# Patient Record
Sex: Female | Born: 2000 | Race: Black or African American | Hispanic: No | Marital: Single | Smoking: Never smoker
Health system: Southern US, Community
[De-identification: ages and names within clinical notes are randomized; demographics above are authoritative.]

---
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
## 2019-12-05 ENCOUNTER — Emergency Department
Admission: EM | Admit: 2019-12-05 | Discharge: 2019-12-05 | Disposition: A | Discharge status: Home / Self Care | Payer: 59 | Attending: Emergency Medicine | Admitting: Emergency Medicine

## 2019-12-05 ENCOUNTER — Other Ambulatory Visit: Payer: Self-pay

## 2019-12-05 DIAGNOSIS — F331 Major depressive disorder, recurrent, moderate: Secondary | ICD-10-CM

## 2019-12-05 DIAGNOSIS — F1290 Cannabis use, unspecified, uncomplicated: Secondary | ICD-10-CM

## 2019-12-05 DIAGNOSIS — S61213A Laceration without foreign body of left middle finger without damage to nail, initial encounter: Secondary | ICD-10-CM | POA: Diagnosis not present

## 2019-12-05 DIAGNOSIS — F1210 Cannabis abuse, uncomplicated: Secondary | ICD-10-CM | POA: Diagnosis not present

## 2019-12-05 DIAGNOSIS — Y9389 Activity, other specified: Secondary | ICD-10-CM | POA: Insufficient documentation

## 2019-12-05 DIAGNOSIS — R4689 Other symptoms and signs involving appearance and behavior: Secondary | ICD-10-CM | POA: Diagnosis not present

## 2019-12-05 DIAGNOSIS — Z23 Encounter for immunization: Secondary | ICD-10-CM | POA: Insufficient documentation

## 2019-12-05 DIAGNOSIS — E860 Dehydration: Secondary | ICD-10-CM | POA: Insufficient documentation

## 2019-12-05 DIAGNOSIS — Y998 Other external cause status: Secondary | ICD-10-CM | POA: Diagnosis not present

## 2019-12-05 DIAGNOSIS — Y92018 Other place in single-family (private) house as the place of occurrence of the external cause: Secondary | ICD-10-CM | POA: Insufficient documentation

## 2019-12-05 DIAGNOSIS — Z046 Encounter for general psychiatric examination, requested by authority: Secondary | ICD-10-CM | POA: Insufficient documentation

## 2019-12-05 DIAGNOSIS — R45851 Suicidal ideations: Secondary | ICD-10-CM

## 2019-12-05 DIAGNOSIS — S6992XA Unspecified injury of left wrist, hand and finger(s), initial encounter: Secondary | ICD-10-CM | POA: Diagnosis present

## 2019-12-05 LAB — CBC
HCT: 35.1 % — ABNORMAL LOW (ref 36.0–46.0)
Hemoglobin: 11.4 g/dL — ABNORMAL LOW (ref 12.0–15.0)
MCH: 27.7 pg (ref 26.0–34.0)
MCHC: 32.5 g/dL (ref 30.0–36.0)
MCV: 85.2 fL (ref 80.0–100.0)
Platelets: 250 10*3/uL (ref 150–400)
RBC: 4.12 MIL/uL (ref 3.87–5.11)
RDW: 14 % (ref 11.5–15.5)
WBC: 5.9 10*3/uL (ref 4.0–10.5)
nRBC: 0 % (ref 0.0–0.2)

## 2019-12-05 LAB — URINALYSIS, COMPLETE (UACMP) WITH MICROSCOPIC
Bacteria, UA: NONE SEEN
Glucose, UA: NEGATIVE mg/dL
Ketones, ur: 80 mg/dL — AB
Leukocytes,Ua: NEGATIVE
Nitrite: NEGATIVE
Protein, ur: 100 mg/dL — AB
RBC / HPF: NONE SEEN RBC/hpf (ref 0–5)
Specific Gravity, Urine: >1.03 — ABNORMAL HIGH (ref 1.005–1.030)
pH: 6 (ref 5.0–8.0)

## 2019-12-05 LAB — COMPREHENSIVE METABOLIC PANEL
ALT: 14 U/L (ref 0–44)
AST: 23 U/L (ref 15–41)
Albumin: 4.7 g/dL (ref 3.5–5.0)
Alkaline Phosphatase: 41 U/L (ref 38–126)
Anion gap: 13 (ref 5–15)
BUN: 19 mg/dL (ref 6–20)
CO2: 22 mmol/L (ref 22–32)
Calcium: 9.3 mg/dL (ref 8.9–10.3)
Chloride: 105 mmol/L (ref 98–111)
Creatinine, Ser: 0.85 mg/dL (ref 0.44–1.00)
GFR calc Af Amer: >60 mL/min (ref >60)
GFR calc non Af Amer: >60 mL/min (ref >60)
Glucose, Bld: 84 mg/dL (ref 70–99)
Potassium: 3.4 mmol/L — ABNORMAL LOW (ref 3.5–5.1)
Sodium: 140 mmol/L (ref 135–145)
Total Bilirubin: 1.5 mg/dL — ABNORMAL HIGH (ref 0.3–1.2)
Total Protein: 7.7 g/dL (ref 6.5–8.1)

## 2019-12-05 LAB — URINE DRUG SCREEN, QUALITATIVE (ARMC ONLY)
Amphetamines, Ur Screen: NOT DETECTED
Barbiturates, Ur Screen: NOT DETECTED
Benzodiazepine, Ur Scrn: NOT DETECTED
Cannabinoid 50 Ng, Ur ~~LOC~~: POSITIVE — AB
Cocaine Metabolite,Ur ~~LOC~~: NOT DETECTED
MDMA (Ecstasy)Ur Screen: NOT DETECTED
Methadone Scn, Ur: NOT DETECTED
Opiate, Ur Screen: NOT DETECTED
Phencyclidine (PCP) Ur S: NOT DETECTED
Tricyclic, Ur Screen: NOT DETECTED

## 2019-12-05 LAB — SALICYLATE LEVEL: Salicylate Lvl: <7 mg/dL — ABNORMAL LOW (ref 7.0–30.0)

## 2019-12-05 LAB — ETHANOL: Alcohol, Ethyl (B): <10 mg/dL (ref <10)

## 2019-12-05 LAB — ACETAMINOPHEN LEVEL: Acetaminophen (Tylenol), Serum: <10 ug/mL — ABNORMAL LOW (ref 10–30)

## 2019-12-05 LAB — POCT PREGNANCY, URINE: Preg Test, Ur: NEGATIVE

## 2019-12-05 MED ORDER — TETANUS-DIPHTH-ACELL PERTUSSIS 5-2.5-18.5 LF-MCG/0.5 IM SUSP
0.5000 mL | Freq: Once | INTRAMUSCULAR | Status: AC
  Administered 2019-12-05: 0.5 mL via INTRAMUSCULAR
  Filled 2019-12-05: qty 0.5

## 2019-12-05 NOTE — ED Notes (Signed)
Patient denies HI/SI. Patient AO X 3. Patient calm and cooperative. Patient given water per request.

## 2019-12-05 NOTE — Discharge Instructions (Signed)
Follow-up with your regular doctor and/or psychiatrist.  Continue taking your medications as prescribed.  Return to the ER for new or recurrent thoughts of suicide, any other thoughts of wanting to hurt yourself or anyone else, or any other new or worsening symptoms that concern you.
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
## 2019-12-05 NOTE — ED Notes (Signed)
Pt discharged home with [employer] (for a ride).   VS stable. Pt denies SI/HI.  All belongings returned to patient. Discharge instructions and housing resources given to patient. Pt signed for discharge.
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
## 2019-12-05 NOTE — ED Notes (Signed)
Patient given water per request.
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
## 2019-12-05 NOTE — ED Provider Notes (Signed)
11:54 AM Patient has been seen and evaluated by psychiatry team and deemed appropriate for discharge. Patient determined not to be a threat to themselves or others. IVC rescinded by psychiatry team. Patient is otherwise medically clear and stable for discharge.       Miguel Aschoff., MD 12/05/19 1154

## 2019-12-05 NOTE — ED Notes (Signed)
Psychiatrist at bedside

## 2019-12-05 NOTE — ED Notes (Signed)
Meal tray provided.

## 2021-01-14 ENCOUNTER — Emergency Department (HOSPITAL_COMMUNITY): Payer: 59

## 2021-01-14 ENCOUNTER — Emergency Department (HOSPITAL_COMMUNITY)
Admission: EM | Admit: 2021-01-14 | Discharge: 2021-01-15 | Disposition: A | Discharge status: Home / Self Care | Payer: 59 | Attending: Emergency Medicine | Admitting: Emergency Medicine

## 2021-01-14 ENCOUNTER — Other Ambulatory Visit: Payer: Self-pay

## 2021-01-14 ENCOUNTER — Encounter (HOSPITAL_COMMUNITY): Payer: Self-pay | Admitting: Emergency Medicine

## 2021-01-14 DIAGNOSIS — W458XXA Other foreign body or object entering through skin, initial encounter: Secondary | ICD-10-CM | POA: Diagnosis not present

## 2021-01-14 DIAGNOSIS — S91031A Puncture wound without foreign body, right ankle, initial encounter: Secondary | ICD-10-CM | POA: Diagnosis not present

## 2021-01-14 DIAGNOSIS — T148XXA Other injury of unspecified body region, initial encounter: Secondary | ICD-10-CM

## 2021-01-14 DIAGNOSIS — W268XXA Contact with other sharp object(s), not elsewhere classified, initial encounter: Secondary | ICD-10-CM | POA: Diagnosis not present

## 2021-01-14 DIAGNOSIS — S99911A Unspecified injury of right ankle, initial encounter: Secondary | ICD-10-CM | POA: Diagnosis present

## 2021-01-14 MED ORDER — IBUPROFEN 400 MG PO TABS
600.0000 mg | ORAL_TABLET | Freq: Once | ORAL | Status: AC
  Administered 2021-01-14: 600 mg via ORAL
  Filled 2021-01-14: qty 1

## 2021-01-14 NOTE — ED Triage Notes (Signed)
Patient stated a small piece of wire punctured her right lateral ankle this evening , no bleeding noted , patient reports mild swelling with pain .

## 2021-01-14 NOTE — ED Provider Notes (Signed)
Emergency Medicine Provider Triage Evaluation Note  Monica Richardson , a 20 y.o. female  was evaluated in triage.  Pt complains of right ankle pain.  Patient reports earlier this evening she was trying to carry a suitcase and there was a piece of wire sticking out from this that went into the side of her right ankle.  She reports she was able to pull out the wire, is unable to explain how deep this went into her ankle.  She reports at first she felt okay but then tried to walk on it and had increasing pain.  Review of Systems  Positive: Ankle pain Negative: Fever, numbness, weakness  Physical Exam  BP 117/68 (BP Location: Right Arm)   Pulse 64   Temp 97.9 F (36.6 C) (Oral)   Resp 17   SpO2 100%  Gen:   Awake, no distress  Resp:  Normal effort  MSK:   Moves extremities without difficulty, tenderness and slight swelling noted over the right ankle, primarily over the lateral malleolus, there is a small pinpoint scab over the malleolus, no bleeding, no obvious bony deformity  Medical Decision Making  Medically screening exam initiated at 9:00 PM.  Appropriate orders placed.  Enzley Kitchens was informed that the remainder of the evaluation will be completed by another provider, this initial triage assessment does not replace that evaluation, and the importance of remaining in the ED until their evaluation is complete.   Legrand Rams 01/14/21 2106    Gerhard Munch, MD 01/14/21 2153

## 2021-01-15 MED ORDER — CIPROFLOXACIN HCL 500 MG PO TABS: 500.0000 mg | ORAL_TABLET | Freq: Two times a day (BID) | ORAL | 0 refills | Status: AC

## 2021-01-15 MED ORDER — CIPROFLOXACIN HCL 500 MG PO TABS
500.0000 mg | ORAL_TABLET | Freq: Once | ORAL | Status: AC
  Administered 2021-01-15: 500 mg via ORAL
  Filled 2021-01-15: qty 1

## 2021-01-15 NOTE — ED Notes (Signed)
The pt walked out without the sl limp

## 2021-01-15 NOTE — ED Provider Notes (Signed)
[facility] EMERGENCY DEPARTMENT Provider Note   CSN: [id number] Arrival date & time: 01/14/21  2017     History Chief Complaint  Patient presents with  . Ankle Pain     Monica Richardson is a 20 y.o. female.  20 yo F here after puncturing her right lateral ankle with a piece of wire. Had swelling. Had pain. Got some crutches whch helped. No meds PTA. No other injuries.         History reviewed. No pertinent past medical history.  Patient Active Problem List   Diagnosis Date Noted  . Aggressive behavior     History reviewed. No pertinent surgical history.   OB History   No obstetric history on file.     No family history on file.  Social History   Tobacco Use  . Smoking status: Never Smoker  . Smokeless tobacco: Never Used  Substance Use Topics  . Alcohol use: No  . Drug use: Yes    Types: Marijuana    Home Medications Prior to Admission medications   Medication Sig Start Date End Date Taking? Authorizing Provider  ciprofloxacin (CIPRO) 500 MG tablet Take 1 tablet (500 mg total) by mouth 2 (two) times daily for 7 days. 01/15/21 01/22/21 Yes Dellene Mcgroarty, Barbara Cower, MD    Allergies    Patient has no known allergies.  Review of Systems   Review of Systems  All other systems reviewed and are negative.   Physical Exam Updated Vital Signs BP 125/78   Pulse 61   Temp 98.1 F (36.7 C) (Oral)   Resp 17   Ht 5\' 8"  (1.727 m)   Wt 56.7 kg   LMP 01/14/2021   SpO2 98%   BMI 19.01 kg/m   Physical Exam Vitals and nursing note reviewed.  Constitutional:      Appearance: She is well-developed.  HENT:     Head: Normocephalic and atraumatic.     Mouth/Throat:     Mouth: Mucous membranes are moist.     Pharynx: Oropharynx is clear.  Eyes:     Pupils: Pupils are equal, round, and reactive to light.  Cardiovascular:     Rate and Rhythm: Normal rate and regular rhythm.  Pulmonary:     Effort: No respiratory distress.     Breath sounds: No stridor.   Abdominal:     General: There is no distension.  Musculoskeletal:     Cervical back: Normal range of motion.  Skin:    General: Skin is warm.     Comments: Small hemostatic wound to dorsal right lateral foot with surrounding edema. No erythema or warmth.   Neurological:     General: No focal deficit present.     Mental Status: She is alert.     ED Results / Procedures / Treatments   Labs (all labs ordered are listed, but only abnormal results are displayed) Labs Reviewed - No data to display  EKG None  Radiology DG Ankle Complete Right  Result Date: 01/14/2021 CLINICAL DATA:  Status post trauma. EXAM: RIGHT ANKLE - COMPLETE 3+ VIEW COMPARISON:  None. FINDINGS: There is no evidence of fracture, dislocation, or joint effusion. There is no evidence of arthropathy or other focal bone abnormality. Very mild anterior and lateral soft tissue swelling is seen. IMPRESSION: Very mild soft tissue swelling without an acute osseous abnormality. Electronically Signed   By: 03/16/2021 M.D.   On: 01/14/2021 21:51    Procedures Procedures   Medications Ordered in ED  Medications  ciprofloxacin (CIPRO) tablet 500 mg (has no administration in time range)  ibuprofen (ADVIL) tablet 600 mg (600 mg Oral Given 01/14/21 2118)    ED Course  I have reviewed the triage vital signs and the nursing notes.  Pertinent labs & imaging results that were available during my care of the patient were reviewed by me and considered in my medical decision making (see chart for details).    MDM Rules/Calculators/A&P                          ppx treatment for pseudomonas with cipro since wire went through shoe into foot. everyting else ok. Stable for discharge.   Final Clinical Impression(s) / ED Diagnoses Final diagnoses:  Puncture wound    Rx / DC Orders ED Discharge Orders         Ordered    ciprofloxacin (CIPRO) 500 MG tablet  2 times daily        01/15/21 0237           Beverlyann Broxterman, Barbara Cower,  MD 01/15/21 [phone]

## 2021-01-15 NOTE — ED Notes (Signed)
The pt c/o a wire from a suitcase went into her rt ankle small puncture wound. No active bleeding

## 2021-04-11 ENCOUNTER — Other Ambulatory Visit: Payer: Self-pay

## 2021-04-11 ENCOUNTER — Ambulatory Visit (HOSPITAL_COMMUNITY)
Admission: EM | Admit: 2021-04-11 | Discharge: 2021-04-12 | Disposition: A | Discharge status: Other Acute Inpatient Hospital | Payer: 59 | Attending: Urology | Admitting: Urology

## 2021-04-11 DIAGNOSIS — Z20822 Contact with and (suspected) exposure to covid-19: Secondary | ICD-10-CM | POA: Insufficient documentation

## 2021-04-11 DIAGNOSIS — F23 Brief psychotic disorder: Secondary | ICD-10-CM | POA: Insufficient documentation

## 2021-04-11 DIAGNOSIS — T7651XA Adult forced sexual exploitation, suspected, initial encounter: Secondary | ICD-10-CM | POA: Insufficient documentation
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
## 2021-04-12 ENCOUNTER — Emergency Department (HOSPITAL_COMMUNITY)
Admission: EM | Admit: 2021-04-12 | Discharge: 2021-04-12 | Disposition: A | Discharge status: Other Acute Inpatient Hospital | Payer: 59 | Attending: Student | Admitting: Student

## 2021-04-12 ENCOUNTER — Encounter (HOSPITAL_COMMUNITY): Payer: Self-pay | Admitting: Urology

## 2021-04-12 ENCOUNTER — Inpatient Hospital Stay (HOSPITAL_COMMUNITY)
Admission: AD | Admit: 2021-04-12 | Discharge: 2021-04-24 | DRG: 885 | Disposition: A | Discharge status: Home / Self Care | Payer: 59 | Source: Ambulatory Visit | Attending: Emergency Medicine | Admitting: Emergency Medicine

## 2021-04-12 ENCOUNTER — Encounter (HOSPITAL_COMMUNITY): Payer: Self-pay | Admitting: Emergency Medicine

## 2021-04-12 DIAGNOSIS — R001 Bradycardia, unspecified: Secondary | ICD-10-CM | POA: Insufficient documentation

## 2021-04-12 DIAGNOSIS — F23 Brief psychotic disorder: Secondary | ICD-10-CM | POA: Diagnosis present

## 2021-04-12 DIAGNOSIS — Z20822 Contact with and (suspected) exposure to covid-19: Secondary | ICD-10-CM | POA: Diagnosis present

## 2021-04-12 DIAGNOSIS — F29 Unspecified psychosis not due to a substance or known physiological condition: Secondary | ICD-10-CM | POA: Diagnosis present

## 2021-04-12 DIAGNOSIS — R259 Unspecified abnormal involuntary movements: Secondary | ICD-10-CM | POA: Insufficient documentation

## 2021-04-12 DIAGNOSIS — R4689 Other symptoms and signs involving appearance and behavior: Secondary | ICD-10-CM | POA: Diagnosis not present

## 2021-04-12 DIAGNOSIS — Z79899 Other long term (current) drug therapy: Secondary | ICD-10-CM | POA: Diagnosis not present

## 2021-04-12 DIAGNOSIS — T7651XA Adult forced sexual exploitation, suspected, initial encounter: Secondary | ICD-10-CM | POA: Diagnosis not present

## 2021-04-12 DIAGNOSIS — F918 Other conduct disorders: Secondary | ICD-10-CM | POA: Diagnosis present

## 2021-04-12 DIAGNOSIS — F1210 Cannabis abuse, uncomplicated: Secondary | ICD-10-CM | POA: Diagnosis present

## 2021-04-12 DIAGNOSIS — F22 Delusional disorders: Secondary | ICD-10-CM | POA: Insufficient documentation

## 2021-04-12 DIAGNOSIS — I440 Atrioventricular block, first degree: Secondary | ICD-10-CM | POA: Diagnosis present

## 2021-04-12 DIAGNOSIS — Z046 Encounter for general psychiatric examination, requested by authority: Secondary | ICD-10-CM | POA: Diagnosis not present

## 2021-04-12 LAB — COMPREHENSIVE METABOLIC PANEL
ALT: 15 U/L (ref 0–44)
AST: 26 U/L (ref 15–41)
Albumin: 4.6 g/dL (ref 3.5–5.0)
Alkaline Phosphatase: 48 U/L (ref 38–126)
Anion gap: 9 (ref 5–15)
BUN: 10 mg/dL (ref 6–20)
CO2: 22 mmol/L (ref 22–32)
Calcium: 9.6 mg/dL (ref 8.9–10.3)
Chloride: 105 mmol/L (ref 98–111)
Creatinine, Ser: 0.87 mg/dL (ref 0.44–1.00)
GFR, Estimated: >60 mL/min (ref >60)
Glucose, Bld: 83 mg/dL (ref 70–99)
Potassium: 3 mmol/L — ABNORMAL LOW (ref 3.5–5.1)
Sodium: 136 mmol/L (ref 135–145)
Total Bilirubin: 0.8 mg/dL (ref 0.3–1.2)
Total Protein: 7.7 g/dL (ref 6.5–8.1)

## 2021-04-12 LAB — CBC
HCT: 36.4 % (ref 36.0–46.0)
Hemoglobin: 11.6 g/dL — ABNORMAL LOW (ref 12.0–15.0)
MCH: 27.4 pg (ref 26.0–34.0)
MCHC: 31.9 g/dL (ref 30.0–36.0)
MCV: 85.8 fL (ref 80.0–100.0)
Platelets: 193 10*3/uL (ref 150–400)
RBC: 4.24 MIL/uL (ref 3.87–5.11)
RDW: 16.3 % — ABNORMAL HIGH (ref 11.5–15.5)
WBC: 6.2 10*3/uL (ref 4.0–10.5)
nRBC: 0 % (ref 0.0–0.2)

## 2021-04-12 LAB — RAPID URINE DRUG SCREEN, HOSP PERFORMED
Amphetamines: NOT DETECTED
Barbiturates: NOT DETECTED
Benzodiazepines: NOT DETECTED
Cocaine: NOT DETECTED
Opiates: NOT DETECTED
Tetrahydrocannabinol: POSITIVE — AB

## 2021-04-12 LAB — HEMOGLOBIN A1C
Hgb A1c MFr Bld: 5.4 % (ref 4.8–5.6)
Mean Plasma Glucose: 108.28 mg/dL

## 2021-04-12 LAB — POC SARS CORONAVIRUS 2 AG -  ED: SARS Coronavirus 2 Ag: NEGATIVE

## 2021-04-12 LAB — PREGNANCY, URINE: Preg Test, Ur: NEGATIVE

## 2021-04-12 LAB — TSH: TSH: 1.988 u[IU]/mL (ref 0.350–4.500)

## 2021-04-12 LAB — LIPID PANEL
Cholesterol: 201 mg/dL — ABNORMAL HIGH (ref 0–200)
HDL: 51 mg/dL (ref >40)
LDL Cholesterol: 138 mg/dL — ABNORMAL HIGH (ref 0–99)
Total CHOL/HDL Ratio: 3.9 RATIO
Triglycerides: 58 mg/dL (ref <150)
VLDL: 12 mg/dL (ref 0–40)

## 2021-04-12 LAB — RESP PANEL BY RT-PCR (FLU A&B, COVID) ARPGX2
Influenza A by PCR: NEGATIVE
Influenza B by PCR: NEGATIVE
SARS Coronavirus 2 by RT PCR: NEGATIVE

## 2021-04-12 MED ORDER — ALUM & MAG HYDROXIDE-SIMETH 200-200-20 MG/5ML PO SUSP
30.0000 mL | ORAL | Status: DC | PRN
Start: 2021-04-12 — End: 2021-04-24

## 2021-04-12 MED ORDER — ACETAMINOPHEN 325 MG PO TABS: 650.0000 mg | ORAL_TABLET | ORAL | Status: DC | PRN

## 2021-04-12 MED ORDER — LORAZEPAM 1 MG PO TABS
1.0000 mg | ORAL_TABLET | ORAL | Status: DC | PRN
Start: 2021-04-12 — End: 2021-04-13

## 2021-04-12 MED ORDER — ONDANSETRON HCL 4 MG PO TABS: 4.0000 mg | ORAL_TABLET | Freq: Three times a day (TID) | ORAL | Status: DC | PRN

## 2021-04-12 MED ORDER — ALUM & MAG HYDROXIDE-SIMETH 200-200-20 MG/5ML PO SUSP: 30.0000 mL | Freq: Four times a day (QID) | ORAL | Status: DC | PRN

## 2021-04-12 MED ORDER — ZIPRASIDONE MESYLATE 20 MG IM SOLR: 20.0000 mg | INTRAMUSCULAR | Status: DC | PRN

## 2021-04-12 MED ORDER — OLANZAPINE 10 MG PO TBDP
10.0000 mg | ORAL_TABLET | Freq: Every day | ORAL | Status: DC
Start: 2021-04-12 — End: 2021-04-13
  Filled 2021-04-12 (×2): qty 1

## 2021-04-12 MED ORDER — POTASSIUM CHLORIDE CRYS ER 20 MEQ PO TBCR
20.0000 meq | EXTENDED_RELEASE_TABLET | Freq: Two times a day (BID) | ORAL | Status: DC
Start: 2021-04-12 — End: 2021-04-14
  Filled 2021-04-12 (×8): qty 1

## 2021-04-12 MED ORDER — TRAZODONE HCL 50 MG PO TABS: 50.0000 mg | ORAL_TABLET | Freq: Every evening | ORAL | Status: DC | PRN

## 2021-04-12 MED ORDER — ZIPRASIDONE MESYLATE 20 MG IM SOLR
20.0000 mg | INTRAMUSCULAR | Status: AC | PRN
  Administered 2021-04-12: 20 mg via INTRAMUSCULAR
  Filled 2021-04-12: qty 20

## 2021-04-12 MED ORDER — POTASSIUM CHLORIDE CRYS ER 20 MEQ PO TBCR
40.0000 meq | EXTENDED_RELEASE_TABLET | ORAL | Status: DC
  Administered 2021-04-12: 40 meq via ORAL
  Filled 2021-04-12: qty 2

## 2021-04-12 MED ORDER — HYDROXYZINE HCL 25 MG PO TABS: 25.0000 mg | ORAL_TABLET | Freq: Once | ORAL | Status: DC

## 2021-04-12 MED ORDER — OLANZAPINE 5 MG PO TBDP: 5.0000 mg | ORAL_TABLET | Freq: Three times a day (TID) | ORAL | Status: DC | PRN

## 2021-04-12 MED ORDER — MAGNESIUM HYDROXIDE 400 MG/5ML PO SUSP
30.0000 mL | Freq: Every day | ORAL | Status: DC | PRN
Start: 2021-04-12 — End: 2021-04-24

## 2021-04-12 MED ORDER — LORAZEPAM 1 MG PO TABS: 1.0000 mg | ORAL_TABLET | ORAL | Status: DC | PRN

## 2021-04-12 MED ORDER — ZOLPIDEM TARTRATE 5 MG PO TABS: 5.0000 mg | ORAL_TABLET | Freq: Every evening | ORAL | Status: DC | PRN

## 2021-04-12 MED ORDER — HYDROXYZINE HCL 25 MG PO TABS
25.0000 mg | ORAL_TABLET | Freq: Three times a day (TID) | ORAL | Status: DC | PRN
  Administered 2021-04-16: 25 mg via ORAL
  Filled 2021-04-12: qty 1

## 2021-04-12 MED ORDER — ACETAMINOPHEN 325 MG PO TABS
650.0000 mg | ORAL_TABLET | Freq: Four times a day (QID) | ORAL | Status: DC | PRN
Start: 2021-04-12 — End: 2021-04-24

## 2021-04-12 MED ORDER — OLANZAPINE 5 MG PO TBDP
5.0000 mg | ORAL_TABLET | Freq: Three times a day (TID) | ORAL | Status: DC | PRN
Start: 2021-04-12 — End: 2021-04-13

## 2021-04-12 NOTE — ED Provider Notes (Addendum)
[facility]-EMERGENCY DEPT Provider Note   CSN: [id number] Arrival date & time: 04/12/21  0448     History Chief Complaint  Patient presents with   IVC    Monica Richardson is a 20 y.o. female without significant past medical history who presents to the emergency department via police under IVC from behavioral health urgent care.  Patient states she is not entirely sure why she is here, she states that she went to the magistrates office due to safety concerns, she has no complaints at this time.  No alleviating or aggravating factors.  She denies fever, chest pain, cough, dyspnea, abdominal pain, vomiting, or diarrhea.  Per chart review behavioral health team discussed with patient's cousin who filled out IVC paperwork and is her roommate who relayed that she has had aggressive behavior and at times seems delusional.   HPI     History reviewed. No pertinent past medical history.  Patient Active Problem List   Diagnosis Date Noted   Aggressive behavior     History reviewed. No pertinent surgical history.   OB History   No obstetric history on file.     History reviewed. No pertinent family history.  Social History   Tobacco Use   Smoking status: Never   Smokeless tobacco: Never  Substance Use Topics   Alcohol use: No   Drug use: Yes    Types: Marijuana    Home Medications Prior to Admission medications   Not on File    Allergies    Patient has no known allergies.  Review of Systems   Review of Systems  Constitutional:  Negative for chills and fever.  Respiratory:  Negative for cough and shortness of breath.   Cardiovascular:  Negative for chest pain.  Gastrointestinal:  Negative for abdominal pain, diarrhea and vomiting.  Neurological:  Negative for syncope.  All other systems reviewed and are negative.  Physical Exam Updated Vital Signs BP 124/83 (BP Location: Left Arm)   Pulse (!) 52   Temp 97.9 F (36.6 C) (Oral)   Resp 16   Ht  5\' 7"  (1.702 m)   Wt 54.4 kg   SpO2 100%   BMI 18.79 kg/m   Physical Exam Vitals and nursing note reviewed.  Constitutional:      General: She is not in acute distress.    Appearance: She is well-developed. She is not toxic-appearing.  HENT:     Head: Normocephalic and atraumatic.  Eyes:     General:        Right eye: No discharge.        Left eye: No discharge.     Conjunctiva/sclera: Conjunctivae normal.  Cardiovascular:     Rate and Rhythm: Regular rhythm. Bradycardia present.  Pulmonary:     Effort: Pulmonary effort is normal. No respiratory distress.     Breath sounds: Normal breath sounds. No wheezing, rhonchi or rales.  Abdominal:     General: There is no distension.     Palpations: Abdomen is soft.     Tenderness: There is no abdominal tenderness.  Musculoskeletal:     Cervical back: Neck supple.  Skin:    General: Skin is warm and dry.     Findings: No rash.  Neurological:     Mental Status: She is alert.     Comments: Clear speech.   Psychiatric:        Behavior: Behavior is withdrawn.     Comments: Inattentive.     ED Results /  Procedures / Treatments   Labs (all labs ordered are listed, but only abnormal results are displayed) Labs Reviewed  PREGNANCY, URINE  RAPID URINE DRUG SCREEN, HOSP PERFORMED    EKG None  Radiology No results found.  Procedures Procedures   Medications Ordered in ED Medications - No data to display  ED Course  I have reviewed the triage vital signs and the nursing notes.  Pertinent labs & imaging results that were available during my care of the patient were reviewed by me and considered in my medical decision making (see chart for details).    MDM Rules/Calculators/A&P                           Patient presents to the emergency department from behavioral health urgent care under IVC, current recommendation is for inpatient psychiatric treatment, pending placement.  She is nontoxic, vitals are without significant  abnormality.  She has no current complaints.  She does seem somewhat inattentive and withdrawn.  COVID test was obtained at behavioral urgent care and was negative, basic labs were also obtained there as well.  UDS and urine pregnancy test ordered in the emergency department as she was not able to provide a sample at [facility].   I discussed patient care with Cecilio Asper NP with [facility]- plan for inpatient psychiatric treatment, likely will be placed at [facility] pending discharges this AM. Cannot hold at [facility] due to IVC therefore being held in the ED. Confirms only need preg test & UDS in the ED which have been ordered. Patient medically cleared. Consult placed to TTS to keep patient on [facility] team list.   The patient has been placed in psychiatric observation due to the need to provide a safe environment for the patient while obtaining psychiatric consultation and evaluation, as well as ongoing medical and medication management to treat the patient's condition.  The patient has been placed under full IVC at this time.  Final Clinical Impression(s) / ED Diagnoses Final diagnoses:  Involuntary commitment    Rx / DC Orders ED Discharge Orders     None        Cherly Anderson, PA-C 04/12/21 0548  CBC w/ mild anemia.  CMP w/ mild hypokalemia- oral potassium ordered.     Cherly Anderson, PA-C 04/12/21 4503    Geoffery Lyons, MD 04/13/21 [phone]

## 2021-04-12 NOTE — ED Notes (Signed)
Pt could not urinate at this time. uds cup was given to pt when she has to go to restroom

## 2021-04-12 NOTE — ED Provider Notes (Signed)
Patient accepted behavioral health hospital.  Vital signs stable.  Stable for transfer   Linwood Dibbles, MD 04/12/21 [phone]

## 2021-04-12 NOTE — ED Notes (Signed)
Report called to [facility] RN charge nurse @[facility] 

## 2021-04-12 NOTE — ED Notes (Signed)
[employer] CALLED FOR TRNASPORT TO [facility] ED
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
## 2021-04-12 NOTE — ED Triage Notes (Signed)
Pt sent from [facility]. They have worked her up and determined that she meets inpatient, but say that they cant hold IVCs. They sent her here to hold.

## 2021-04-12 NOTE — Progress Notes (Signed)
Psychoeducational Group Note  Date:  04/12/2021 Time:  2126  Group Topic/Focus:  Wrap-Up Group:   The focus of this group is to help patients review their daily goal of treatment and discuss progress on daily workbooks.  Participation Level: Did Not Attend  Participation Quality:  Not Applicable  Affect:  Not Applicable  Cognitive:  Not Applicable  Insight:  Not Applicable  Engagement in Group: Not Applicable  Additional Comments:  The patient did not attend group this evening.   Hazle Coca S 04/12/2021, 9:26 PM

## 2021-04-12 NOTE — Progress Notes (Signed)
Admission Note:   20 Year old presents IVC was accepted for the treatment for SI thoughts and Depression. Pt appears flat, guarded and depressed. Pt was calm and cooperative with admission process. Poor eye contact. Pt denies SI/AVH/HI and pain on admission. Pt perception of hospitalization is " I went to the magistrate and then they just sent me here".   Skin was assessed and found to be clear of any abnormal marks apart from a larger healed scar on the right thigh area which pt refused to discuss. PT searched and no contraband found, POC and unit policies explained and understanding verbalized. Consents obtained. Food and fluids offered and both accepted. Pt had no additional questions or concerns. Will continue to monitor and assess. Safety maintained.

## 2021-04-12 NOTE — BH Assessment (Addendum)
BHH Assessment Progress Note   Per Cecilio Asper, NP, this pt requires psychiatric hospitalization.  Danika, RN, [facility] has assigned pt to [facility] Rm 402-1 to the service of Dr Mason Jim.  [facility] will be ready to receive pt at 10:30.  Pt presents under IVC initiated by pt's cousin which EDP Linwood Dibbles, MD agrees to [facility].  IVC documents will be faxed to [facility] once First Examination is completed.  Dr Lynelle Doctor and pt's nurse, Waynetta Sandy, have been notified, and Waynetta Sandy agrees to call report to [phone].  Pt is to be transported via Patent examiner.   Doylene Canning, [place] Behavioral Health Coordinator [phone]   Addendum:  First Examination has been completed, and IVC documents have been faxed to [facility].  Pt is ready to be transferred.  Dr Lynelle Doctor and Waynetta Sandy have been notified.  Doylene Canning, [place] Behavioral Health Coordinator [phone]

## 2021-04-12 NOTE — ED Notes (Signed)
Pt resting@this time. Pt calm and cooperative. No c/o of pain or distress. Will continue to monitor for safety  

## 2021-04-12 NOTE — Discharge Instructions (Signed)
Transferred to [facility]

## 2021-04-12 NOTE — ED Triage Notes (Signed)
Pt went to [facility] because IVC papers were taken out.  [place] called police back because they can only keep IVC patients for 4 hours.  Pt under IVC because pt has lost contact with reality and thinks people are out to get her and is a danger to herself because she states she needs to take herself out.  IVC papers state pt is hearing voices and having visual hallucinations as well.

## 2021-04-12 NOTE — Progress Notes (Signed)
Patient presents with  labile and blunted affect. Patient refused her hs Zyprexa stated she does not needs medication it was just a miss understanding.   Patient awake at 0146 asking why she is shaking and why they gave her two big pills of potasium at the other hospital because she does not take medications. Patient asking why she is here stating she is only here because of the miss understanding with the Magistery. Patient is calm refused any medications at present. Patient denies SI/HI/A/VH and verbally contracted for safety.   Support and encouragement provided as needed.

## 2021-04-12 NOTE — Progress Notes (Signed)
Did not attend group 

## 2021-04-12 NOTE — ED Notes (Signed)
Pt was dressed out into to purple scrubs. Pt's belongings of pants, t shirt, bra, and bracelet was placed into 1-4 cabinet.

## 2021-04-12 NOTE — ED Notes (Signed)
Pt demanding to get her out of here

## 2021-04-12 NOTE — ED Notes (Signed)
Pt DC d off unit to [facility] per provider. Pt alert, cooperative, no s/s of distress. DC information and belongings given to [employer]. Pt ambulatory off unit, escorted and transported by [employer].

## 2021-04-13 DIAGNOSIS — F1210 Cannabis abuse, uncomplicated: Secondary | ICD-10-CM | POA: Diagnosis present

## 2021-04-13 DIAGNOSIS — R4689 Other symptoms and signs involving appearance and behavior: Secondary | ICD-10-CM | POA: Diagnosis not present

## 2021-04-13 DIAGNOSIS — F29 Unspecified psychosis not due to a substance or known physiological condition: Secondary | ICD-10-CM | POA: Diagnosis not present

## 2021-04-13 LAB — COMPREHENSIVE METABOLIC PANEL
ALT: 13 U/L (ref 0–44)
AST: 23 U/L (ref 15–41)
Albumin: 5 g/dL (ref 3.5–5.0)
Alkaline Phosphatase: 55 U/L (ref 38–126)
Anion gap: 10 (ref 5–15)
BUN: 16 mg/dL (ref 6–20)
CO2: 24 mmol/L (ref 22–32)
Calcium: 10.2 mg/dL (ref 8.9–10.3)
Chloride: 105 mmol/L (ref 98–111)
Creatinine, Ser: 0.87 mg/dL (ref 0.44–1.00)
GFR, Estimated: >60 mL/min (ref >60)
Glucose, Bld: 130 mg/dL — ABNORMAL HIGH (ref 70–99)
Potassium: 4.6 mmol/L (ref 3.5–5.1)
Sodium: 139 mmol/L (ref 135–145)
Total Bilirubin: 0.6 mg/dL (ref 0.3–1.2)
Total Protein: 8.1 g/dL (ref 6.5–8.1)

## 2021-04-13 LAB — SEDIMENTATION RATE: Sed Rate: 6 mm/hr (ref 0–22)

## 2021-04-13 LAB — VITAMIN B12: Vitamin B-12: 329 pg/mL (ref 180–914)

## 2021-04-13 MED ORDER — ENSURE ENLIVE PO LIQD
237.0000 mL | Freq: Two times a day (BID) | ORAL | Status: DC
  Administered 2021-04-13 – 2021-04-24 (×4): 237 mL via ORAL
  Filled 2021-04-13 (×26): qty 237

## 2021-04-13 MED ORDER — ZIPRASIDONE MESYLATE 20 MG IM SOLR: 20.0000 mg | INTRAMUSCULAR | Status: DC | PRN

## 2021-04-13 MED ORDER — OLANZAPINE 10 MG PO TBDP
10.0000 mg | ORAL_TABLET | Freq: Three times a day (TID) | ORAL | Status: DC | PRN
  Administered 2021-04-16: 10 mg via ORAL
  Filled 2021-04-13: qty 1

## 2021-04-13 MED ORDER — ZIPRASIDONE MESYLATE 20 MG IM SOLR
20.0000 mg | INTRAMUSCULAR | Status: AC | PRN
  Administered 2021-04-13: 20 mg via INTRAMUSCULAR
  Filled 2021-04-13: qty 20

## 2021-04-13 MED ORDER — LORAZEPAM 2 MG/ML IJ SOLN
1.0000 mg | Freq: Once | INTRAMUSCULAR | Status: AC
  Administered 2021-04-13: 1 mg via INTRAMUSCULAR

## 2021-04-13 MED ORDER — LORAZEPAM 1 MG PO TABS
2.0000 mg | ORAL_TABLET | ORAL | Status: DC | PRN
Start: 2021-04-13 — End: 2021-04-13

## 2021-04-13 MED ORDER — ZIPRASIDONE MESYLATE 20 MG IM SOLR: 20.0000 mg | Freq: Two times a day (BID) | INTRAMUSCULAR | Status: DC | PRN

## 2021-04-13 MED ORDER — OLANZAPINE 5 MG PO TBDP
5.0000 mg | ORAL_TABLET | Freq: Every day | ORAL | Status: DC
  Filled 2021-04-13 (×2): qty 1

## 2021-04-13 MED ORDER — LORAZEPAM 1 MG PO TABS: 2.0000 mg | ORAL_TABLET | Freq: Four times a day (QID) | ORAL | Status: DC | PRN

## 2021-04-13 MED ORDER — LORAZEPAM 2 MG/ML IJ SOLN
INTRAMUSCULAR | Status: AC
  Filled 2021-04-13: qty 1

## 2021-04-13 MED ORDER — OLANZAPINE 10 MG PO TBDP
10.0000 mg | ORAL_TABLET | Freq: Every day | ORAL | Status: DC
  Filled 2021-04-13 (×3): qty 1

## 2021-04-13 MED ORDER — LORAZEPAM 2 MG/ML IJ SOLN: 2.0000 mg | Freq: Four times a day (QID) | INTRAMUSCULAR | Status: DC | PRN

## 2021-04-13 MED ORDER — LORAZEPAM 1 MG PO TABS
1.0000 mg | ORAL_TABLET | ORAL | Status: DC | PRN
Start: 2021-04-13 — End: 2021-04-16
  Filled 2021-04-13: qty 1

## 2021-04-13 NOTE — Progress Notes (Addendum)
Pt agitated and cursing in the hallway. Pt slamming her hands on the nurses station and yelling at staff. Says she doesn't need to be here. "It's a misunderstanding. I'm just here to pass the time that's all. I'm not here to take any fucking medicine! What y'all want from me?" Pt on the phone yelling at no one because phones were turned off. Pt now at end of hall talking to someone who is not there.  Pt walking up and down the hall agitated. Walked into another pt's room and had to be redirected to leave.  Provider notified. Pt has agitation protocol. Has already refused PO meds. Provider gave order for Ativan 1 mg IM. Pt will be given Geodon 20 mg IM along with Ativan 1 mg IM for severe agitation.Will continue to monitor.

## 2021-04-13 NOTE — Progress Notes (Addendum)
Pt given agitation medications using manual hold for 15 seconds to administer injections. Pt continues to stand outside of room, yelling at everyone. Pt redirected back to her room. Pt still cursing. MHT continues to try and redirect patient. MHT sitting outside pt room until medication that was given begins to take effect. Pt refused vital sign assessment. Will continue to monitor.

## 2021-04-13 NOTE — BHH Group Notes (Signed)
Type of Therapy and Topic:  Group Therapy:  Strengths Exploration   Participation Level: Did not attend   Description of Group: This group allows individuals to explore their strengths, learn to use strengths in new ways to improve well-being. Strengths-based interventions involve identifying strengths, understanding how they are used, and learning new ways to apply them. Individuals will identify their strengths, and then explore their roles in different areas of life (relationships, professional life, and personal fulfillment). Individuals will think about ways in which they currently use their strengths, along with new ways they could begin using them.    Therapeutic Goals Patient will verbalize two of their strengths Patient will identify how their strengths are currently used Patient will identify two new ways to apply their strengths  Patients will create a plan to apply their strengths in their daily lives     Summary of Patient Progress:  Did not attend  
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
## 2021-04-13 NOTE — Progress Notes (Addendum)
Patient became very upset this morning talking on phone to her family.  Stated someone had lied to her, etc.  Patient was sitting in hallway of 400 hall, yelling, fidegity.  Patient did go to her room to rest.  Patient was moved to 500 hall.  Respirations even and unlabored at this time.  No signs/symptoms of pain/distress on patient's face/body movements Safety maintained with 15 minute checks. Marland Kitchen

## 2021-04-13 NOTE — Progress Notes (Signed)
Pt listed Monica Richardson [phone]) her cousin as her contact if ever placed in restraints. Person listed was contacted to inform him of manual hold for medication administration. Person verbalized understanding.
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
## 2021-04-13 NOTE — Progress Notes (Signed)
Recreation Therapy Notes  Date: 8.3.22 Time: 0930 Location: 300 Hall Dayroom  Group Topic: Stress Management   Goal Area(s) Addresses:  Patient will actively participate in stress management techniques presented during session.  Patient will successfully identify benefit of practicing stress management post d/c.   Intervention: Guided exercise with ambient sound and script  Activity :Guided Imagery  LRT read a script that focused on finding peace and relaxation while envisioning being on the beach at sunrise. Patients were given suggestions of ways to access scripts post d/c and encouraged to explore Youtube and other apps available on smartphones, tablets, and computers.   Education:  Stress Management, Discharge Planning.   Education Outcome: Acknowledges education  Clinical Observations/Feedback: Patient did not attend group session.     Caroll Rancher, LRT/CTRS         Caroll Rancher A 04/13/2021 11:51 AM

## 2021-04-13 NOTE — Tx Team (Signed)
Interdisciplinary Treatment and Diagnostic Plan Update  04/13/2021 Time of Session: 9:30am  Monica Richardson MRN: [id number]  Principal Diagnosis: <principal problem not specified>  Secondary Diagnoses: Active Problems:   Acute psychosis ([place])   Current Medications:  Current Facility-Administered Medications  Medication Dose Route Frequency Provider Last Rate Last Admin   acetaminophen (TYLENOL) tablet 650 mg  650 mg Oral Q6H PRN Prescilla Sours, PA-C       alum & mag hydroxide-simeth (MAALOX/MYLANTA) 200-200-20 MG/5ML suspension 30 mL  30 mL Oral Q4H PRN Margorie John W, PA-C       hydrOXYzine (ATARAX/VISTARIL) tablet 25 mg  25 mg Oral TID PRN Margorie John W, PA-C       OLANZapine zydis (ZYPREXA) disintegrating tablet 5 mg  5 mg Oral Q8H PRN Ethelene Hal, NP       And   LORazepam (ATIVAN) tablet 1 mg  1 mg Oral PRN Ethelene Hal, NP       And   ziprasidone (GEODON) injection 20 mg  20 mg Intramuscular PRN Ethelene Hal, NP       magnesium hydroxide (MILK OF MAGNESIA) suspension 30 mL  30 mL Oral Daily PRN Margorie John W, PA-C       OLANZapine zydis (ZYPREXA) disintegrating tablet 10 mg  10 mg Oral QHS Ethelene Hal, NP       potassium chloride SA (KLOR-CON) CR tablet 20 mEq  20 mEq Oral BID Ethelene Hal, NP       traZODone (DESYREL) tablet 50 mg  50 mg Oral QHS PRN Prescilla Sours, PA-C       PTA Medications: No medications prior to admission.    Patient Stressors:    Patient Strengths:    Treatment Modalities: Medication Management, Group therapy, Case management,  1 to 1 session with clinician, Psychoeducation, Recreational therapy.   Physician Treatment Plan for Primary Diagnosis: <principal problem not specified> Long Term Goal(s):     Short Term Goals:    Medication Management: Evaluate patient's response, side effects, and tolerance of medication regimen.  Therapeutic Interventions: 1 to 1 sessions, Unit Group sessions and  Medication administration.  Evaluation of Outcomes: Not Met  Physician Treatment Plan for Secondary Diagnosis: Active Problems:   Acute psychosis ([place])  Long Term Goal(s):     Short Term Goals:       Medication Management: Evaluate patient's response, side effects, and tolerance of medication regimen.  Therapeutic Interventions: 1 to 1 sessions, Unit Group sessions and Medication administration.  Evaluation of Outcomes: Not Met   RN Treatment Plan for Primary Diagnosis: <principal problem not specified> Long Term Goal(s): Knowledge of disease and therapeutic regimen to maintain health will improve  Short Term Goals: Ability to remain free from injury will improve, Ability to participate in decision making will improve, Ability to verbalize feelings will improve, Ability to disclose and discuss suicidal ideas, and Ability to identify and develop effective coping behaviors will improve  Medication Management: RN will administer medications as ordered by provider, will assess and evaluate patient's response and provide education to patient for prescribed medication. RN will report any adverse and/or side effects to prescribing provider.  Therapeutic Interventions: 1 on 1 counseling sessions, Psychoeducation, Medication administration, Evaluate responses to treatment, Monitor vital signs and CBGs as ordered, Perform/monitor CIWA, COWS, AIMS and Fall Risk screenings as ordered, Perform wound care treatments as ordered.  Evaluation of Outcomes: Not Met   LCSW Treatment Plan for Primary Diagnosis: <principal problem not specified>  Long Term Goal(s): Safe transition to appropriate next level of care at discharge, Engage patient in therapeutic group addressing interpersonal concerns.  Short Term Goals: Engage patient in aftercare planning with referrals and resources, Increase social support, Increase emotional regulation, Facilitate acceptance of mental health diagnosis and concerns,  Identify triggers associated with mental health/substance abuse issues, and Increase skills for wellness and recovery  Therapeutic Interventions: Assess for all discharge needs, 1 to 1 time with Social worker, Explore available resources and support systems, Assess for adequacy in community support network, Educate family and significant other(s) on suicide prevention, Complete Psychosocial Assessment, Interpersonal group therapy.  Evaluation of Outcomes: Not Met   Progress in Treatment: Attending groups: No. Participating in groups: No. Taking medication as prescribed: Yes. Toleration medication: Yes. Family/Significant other contact made: Yes, individual(s) contacted:  If consents are provided Patient understands diagnosis: No. Discussing patient identified problems/goals with staff: Yes. Medical problems stabilized or resolved: Yes. Denies suicidal/homicidal ideation: Yes. Issues/concerns per patient self-inventory: No.   New problem(s) identified: No, Describe:  None   New Short Term/Long Term Goal(s): medication stabilization, elimination of SI thoughts, development of comprehensive mental wellness plan.   Patient Goals: "To go home"   Discharge Plan or Barriers: Patient recently admitted. CSW will continue to follow and assess for appropriate referrals and possible discharge planning.   Reason for Continuation of Hospitalization: Anxiety Hallucinations Medication stabilization  Estimated Length of Stay: 3 to 5 days   Attendees: Patient: Monica Richardson 04/13/2021   Physician: Lestine Mount, DO 04/13/2021   Nursing:  04/13/2021   RN Care Manager: 04/13/2021   Social Worker: Verdis Frederickson, LCSWA 04/13/2021   Recreational Therapist:  04/13/2021   Other:  04/13/2021   Other:  04/13/2021   Other: 04/13/2021     Scribe for Treatment Team: Darleen Crocker, [place] 04/13/2021 9:54 AM

## 2021-04-13 NOTE — Plan of Care (Signed)
Nurse discussed anxiety, depression and coping skills with patient.  

## 2021-04-14 DIAGNOSIS — F29 Unspecified psychosis not due to a substance or known physiological condition: Secondary | ICD-10-CM | POA: Diagnosis not present

## 2021-04-14 LAB — CERULOPLASMIN: Ceruloplasmin: 19.9 mg/dL (ref 19.0–39.0)

## 2021-04-14 LAB — RPR: RPR Ser Ql: NONREACTIVE

## 2021-04-14 LAB — ANA: Anti Nuclear Antibody (ANA): NEGATIVE

## 2021-04-14 MED ORDER — OLANZAPINE 10 MG PO TBDP
10.0000 mg | ORAL_TABLET | Freq: Every day | ORAL | Status: DC
  Administered 2021-04-14: 10 mg via ORAL
  Filled 2021-04-14 (×2): qty 1

## 2021-04-14 MED ORDER — OLANZAPINE 10 MG IM SOLR
5.0000 mg | Freq: Every day | INTRAMUSCULAR | Status: DC
  Filled 2021-04-14 (×2): qty 10

## 2021-04-14 NOTE — Progress Notes (Signed)
Psychoeducational Group Note  Date:  04/14/2021 Time:  2038  Group Topic/Focus:  Wrap-Up Group:   The focus of this group is to help patients review their daily goal of treatment and discuss progress on daily workbooks.  Participation Level: Did Not Attend  Participation Quality:  Not Applicable  Affect:  Not Applicable  Cognitive:  Not Applicable  Insight:  Not Applicable  Engagement in Group: Not Applicable  Additional Comments:  The patient did not attend group this evening.   Hazle Coca S 04/14/2021, 8:39 PM

## 2021-04-14 NOTE — Progress Notes (Addendum)
   04/13/21 1950  Psych Admission Type (Psych Patients Only)  Admission Status Involuntary  Psychosocial Assessment  Patient Complaints Anger;Irritability  Eye Contact Brief;Avoids  Facial Expression Anxious;Angry  Affect Labile  Speech Loud;Aggressive  Interaction Minimal;No initiation  Motor Activity Pacing  Appearance/Hygiene In scrubs  Behavior Characteristics Unwilling to participate;Agressive verbally;Agitated;Irritable;Pacing  Mood Labile;Suspicious;Angry  Aggressive Behavior  Targets Other (Comment) (staff)  Type of Behavior Verbal;Unprovoked  Effect No apparent injury  Thought Process  Coherency Circumstantial  Content Blaming others  Delusions None reported or observed  Perception Hallucinations  Hallucination Auditory;Visual (pt denies AVH but seen talking to someone not there at the end of the hallway)  Judgment Poor  Confusion None  Danger to Self  Current suicidal ideation? Denies  Danger to Others  Danger to Others Reported or observed  Danger to Others Abnormal  Harmful Behavior to others Threats of violence towards other people observed or expressed   Destructive Behavior No threats or harm toward property  Description of Harmful Behavior verbally aggressive and threatening towards staff   Pt seen in her room. Pt avertive and giving short quick answers. Pt denied SI, HI, AVH and pain. Pt irritable and angry about medications.

## 2021-04-14 NOTE — Progress Notes (Signed)
Pt did not attend orientation/goals group. 

## 2021-04-14 NOTE — Progress Notes (Signed)
Recreation Therapy Notes  INPATIENT RECREATION THERAPY ASSESSMENT  Patient Details Name: Clarissa Laird MRN: [id number] DOB: [birth year] Today's Date: 04/14/2021       Information Obtained From: Patient  Able to Participate in Assessment/Interview: Yes  Patient Presentation: Alert  Reason for Admission (Per Patient): Other (Comments) (Pt stated she went to the magistrate's office about a misunderstanding, went home and was arrested and brought here.)  Patient Stressors: Other (Comment) (Needs own apartment, working on going back in school)  Coping Skills:   Sports, Exercise, Music, Talk, Art, Prayer, Avoidance, Read, Dance, Hot Bath/Shower  Leisure Interests (2+):  Exercise - Running, Exercise - Lifting Weights, Social - Friends, Individual - Other (Comment), Community - Other (Comment) (Cooking; Church)  Frequency of Recreation/Participation: Other (Comment) (Cook/Friends/Exercise- Daily; Church- Sometimes)  Awareness of Community Resources:  Yes  Community Resources:  Research scientist (physical sciences), [place], Tree surgeon, Public affairs consultant  Current Use: Yes  If no, Barriers?:    Expressed Interest in [employer] Information: No  [employer] of Residence:  [place]  Patient Main Form of Transportation: Set designer  Patient Strengths:  Keeping people relaxed; Helpful; Keeping up with the world  Patient Identified Areas of Improvement:  None  Patient Goal for Hospitalization:  "get through it"  Current SI (including self-harm):  No  Current HI:  No  Current AVH: No  Staff Intervention Plan: Group Attendance, Collaborate with Interdisciplinary Treatment Team  Consent to Intern Participation: N/A   Caroll Rancher, LRT/CTRS  Caroll Rancher A 04/14/2021, 2:38 PM

## 2021-04-14 NOTE — Progress Notes (Signed)
[facility] Second Physician Opinion Progress Note for Medication Administration to Non-consenting Patients (For Involuntarily Committed Patients)  Patient: Monica Richardson Date of Birth: 08/10/2001 MRN: [id number]  I was asked to see the patient by her attending psychiatrist Dr. Amy Singleton for second opinion regarding medication administration to a nonconsenting patient.  Medical record reviewed.  Patient's case discussed in detail with treatment team and nursing staff.  Monica Richardson is a 20-year-old female admitted under IVC petition due to worsening psychotic symptoms, paranoia, erratic behavior and aggression.  Since admission the patient has continued to present as guarded, paranoid, delusional, pressured, intermittently agitated, yelling, irritable, labile, disorganized and religiously preoccupied.  Staff have observed patient responding to internal stimuli.  Patient has refused scheduled medications since admission including scheduled PO olanzapine and PO potassium supplement.  Yesterday evening patient became agitated, pacing the hallways, entered another patient's room, and was yelling and swearing at staff.  She was unable to be verbally redirected, refused p.o. medications and received IM lorazepam and IM Geodon.  I met with and evaluated the patient on the unit today with nursing staff present.  On my interview with patient this morning, she appears tired from IM medications she received last night.  Patient presents as guarded, irritable, paranoid and delusional.  Patient maintains that she is in the hospital because there was a misunderstanding by the magistrate after she went to the police department to inquire about an app that could be used to watch a person on their phone.  Patient states that the behaviors prior to admission reported by family did not occur.  She denies all psychiatric symptoms stating that she is emotional only because she is a [place].  Patient denies that she has a psychiatric  condition and states her belief she does not need medications.  Reason for the Medication: The patient, without the benefit of the specific treatment measure, is incapable of participating in any available treatment plan that will give the patient a realistic opportunity of improving the patient's condition. There is, without the benefit of the specific treatment measure, a significant possibility that the patient will harm self or others before improvement of the patient's condition is realized.  Consideration of Side Effects: Consideration of the side effects related to the medication plan has been given.  Rationale for Medication Administration: Patient has no insight into her psychiatric condition, the reasons for hospitalization or the need for treatment.  As a result of her psychiatric symptoms, patient is unable to meaningfully participate in decisions regarding her treatment, including decisions about medication.  Without treatment, patient's symptoms are likely to continue and to result in increased disability to patient and future incidents of aggression towards others.  Based on my overall assessment, believe the potential benefits to patient of administering medications over objection outweigh the potential risks.     L , MD 04/14/21  8:28 AM   This documentation is good for (7) seven days from the date of the MD signature. New documentation must be completed every seven (7) days with detailed justification in the medical record if the patient requires continued non-emergent administration of psychotropic medications. 

## 2021-04-14 NOTE — [facility] Group Notes (Signed)
Occupational Therapy Group Note Date: 04/14/2021 Group Topic/Focus: Coping Skills  Group Description: Group Description: Group encouraged increased engagement and participation through discussion focused on coping through music. Group members were encouraged to create a "coping skills playlist" that consisted of 20 songs with different cited categories/topics including "a song that reminds you of a good memory" and "a song that makes you want to dance", etc. Discussion followed with patients sharing their playlists and choosing one for the group to listen and respond too.   Therapeutic Goal(s): Identify positive songs to improve coping through music Identify and share benefit of music as a coping strategy  Participation Level: Patient did not attend OT group session despite personal invitation from [facility].   Plan: Continue to engage patient in OT groups 2 - 3x/week.  04/14/2021  Donne Hazel, MOT, OTR/L
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
## 2021-04-14 NOTE — Progress Notes (Signed)
Recreation Therapy Notes  Date: 8.4.22 Time: 1000 Location: 500 Hall Dayroom   Group Topic: Coping Skills   Goal Area(s) Addresses: Patient will define what a coping skill is. Patient will create a list of healthy coping skills beginning with each letter of the alphabet. Patient will successfully identify positive coping skills they can use post d/c. Patient will acknowledge benefit(s) of using learned coping skills post d/c.   Intervention: Blank Coping Skills A to Z sheet, Pencils   Activity: Coping A to Z. Patient asked to identify what a coping skill is and when they use them. Patients with Clinical research associate discussed healthy versus unhealthy coping skills. Next patients were given a blank worksheet titled "Coping Skills A-Z".  Partners were instructed to come up with at least one positive coping skill per letter of the alphabet, addressing a specific challenge (ex: stress, anger, anxiety, depression, grief, doubt, isolation, self-harm/suicidal thoughts, substance use). Patients were given 15 minutes to brainstorm before ideas were presented to the large group. Patients and LRT debriefed on the importance of coping skill selection based on situation and back-up plans when a skill tried is not effective. At the end of group, patients were given an handout of alphabetized strategies to keep for future reference.   Education: Pharmacologist, Scientist, physiological, Discharge Planning.   Education Outcome: Acknowledges education/Verbalizes understanding/In group clarification offered/Additional education needed   Clinical Observations/Feedback: Pt did not attend group session.      Caroll Rancher, LRT/CTRS         Caroll Rancher A 04/14/2021 12:18 PM

## 2021-04-14 NOTE — Plan of Care (Signed)
  Problem: Education: Goal: Knowledge of [facility] Education information/materials will improve Outcome: Progressing Goal: Emotional status will improve Outcome: Progressing Goal: Mental status will improve Outcome: Progressing Goal: Verbalization of understanding the information provided will improve Outcome: Progressing   
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
## 2021-04-15 DIAGNOSIS — F29 Unspecified psychosis not due to a substance or known physiological condition: Secondary | ICD-10-CM | POA: Diagnosis not present

## 2021-04-15 MED ORDER — OLANZAPINE 15 MG PO TBDP
15.0000 mg | ORAL_TABLET | Freq: Every day | ORAL | Status: DC
  Administered 2021-04-15: 15 mg via ORAL
  Filled 2021-04-15 (×3): qty 1

## 2021-04-15 MED ORDER — OLANZAPINE 10 MG IM SOLR
5.0000 mg | Freq: Every day | INTRAMUSCULAR | Status: DC
  Filled 2021-04-15 (×3): qty 10

## 2021-04-15 NOTE — Progress Notes (Signed)
Pt did not attend orientation/goals group. 

## 2021-04-15 NOTE — Progress Notes (Signed)
Monica Richardson was isolative to her room all evening.  She did take her hs medications with much encouragement.  She denied SI/HI or AVH.  She is guarded and minimal with conversation.  She denied any pain or discomfort and appeared to be in no physical distress.       04/14/21 2141  Psych Admission Type (Psych Patients Only)  Admission Status Involuntary  Psychosocial Assessment  Patient Complaints Anxiety;Suspiciousness;Insomnia  Eye Contact Brief  Facial Expression Pensive  Affect Irritable;Preoccupied  Speech Soft;Slow  Interaction Cautious;Forwards little;Guarded;Isolative;Minimal  Motor Activity Slow  Appearance/Hygiene Disheveled;In scrubs  Behavior Characteristics Unwilling to participate;Anxious;Guarded  Mood Anxious;Suspicious;Preoccupied;Irritable  Thought Administrator, sports thinking  Content Blaming others;Paranoia  Delusions Paranoid  Perception Hallucinations  Hallucination None reported or observed (None observed this evening)  Judgment Poor  Confusion None  Danger to Self  Current suicidal ideation? Denies  Danger to Others  Danger to Others None reported or observed

## 2021-04-15 NOTE — Progress Notes (Signed)
Adult Psychoeducational Group Note  Date:  04/15/2021 Time:  11:16 PM  Group Topic/Focus:  Wrap-Up Group:   The focus of this group is to help patients review their daily goal of treatment and discuss progress on daily workbooks.  Participation Level:  Did Not Attend  Participation Quality:   Did Not Attend  Affect:   Did Not Attend  Cognitive:   Did Not Attend  Insight: None  Engagement in Group:   Did Not Attend  Modes of Intervention:   Did Not Attend  Additional Comments:  Pt did not attend evening wrap up group tonight.  [name] 04/15/2021, 11:16 PM

## 2021-04-15 NOTE — Progress Notes (Signed)
Recreation Therapy Notes  Date: 8.5.22 Time: 1000 Location: 500 Hall Dayroom  Group Topic: Leisure Education  Goal Area(s) Addresses:  Patient will identify positive leisure and recreation activities.  Patient will identify one positive benefit of participation in leisure activities.   Behavioral Response: Engaged  Intervention: Group Game  Activity: Leisure IT trainer. Patients were divided in to 2 groups for game play. LRT used small strips of paper with 1 listed leisure or recreation activity. Patients took turns randomly drawing a slip of paper and drawing the identified activity on the board without using words or sounds. Points were awarded to the team with the first correct guess. After several rounds of game play, team with the most points were declared the winners.   Education:  Teacher, English as a foreign language, Special educational needs teacher, Discharge Planning  Education Outcome: Acknowledges education/In group clarification offered/Needs additional education  Clinical Observations/Feedback: Pt was quiet but attentive.  Pt needed some encouragement to make attempts to guess the drawings of others and to do drawings on her own.  Pt was appropriate during group session.    Caroll Rancher, LRT/CTRS         Caroll Rancher A 04/15/2021 12:01 PM

## 2021-04-15 NOTE — Plan of Care (Signed)
  Problem: Education: Goal: Knowledge of [place] Education information/materials will improve Outcome: Progressing Goal: Emotional status will improve Outcome: Progressing   Problem: Education: Goal: Knowledge of [facility] Education information/materials will improve Outcome: Progressing Goal: Emotional status will improve Outcome: Progressing

## 2021-04-15 NOTE — Progress Notes (Addendum)
[facility]  04/15/2021 3:00 PM Monica LingoHeaven Richardson  MRN:  [id number] Subjective:   [name] is a 20 yr old female who presents under IVC for paranoia and delusional thoughts. PPHx is significant for previous IVC in March 2021 due to aggression.   While attempting to interview patient today she was walking down the hallway away from them.  She answered in short 1-2 word sentences.  She only responded to my questions and did not offer any other communication.  Later on per nursing she was going up and down the hallways yelling and being confrontational.  Per nursing seem to be responding to voices arguing with people who were not around her.   She reports that she is doing well today.  She reports that she slept well.  She reports that her appetite is doing well.  She reports no SI, HI, or AVH.  When asked if there was anything she wished to discuss or talk about she responded with no.  She again reiterates that she does not need to be here nor did she need medication.  When asked if I could contact her mother she stated that I did not have permission to talk with her.  She then had nothing further to say.  She had no other concerns at present. Principal Problem: Schizophrenia spectrum disorder with psychotic disorder type not yet determined (HCC) Diagnosis: Principal Problem:   Schizophrenia spectrum disorder with psychotic disorder type not yet determined [facility](HCC) Active Problems:   Aggressive behavior   Cannabis abuse  Total Time spent with patient: 30 minutes  Past Psychiatric History: IVC in March 2021 due to aggression  Past Medical History: History reviewed. No pertinent past medical history. History reviewed. No pertinent surgical history. Family History: History reviewed. No pertinent family history. Family Psychiatric  History: None Social History:  Social History   Substance and Sexual Activity  Alcohol Use No     Social History   Substance and Sexual Activity  Drug Use  Yes   Types: Marijuana    Social History   Socioeconomic History   Marital status: Single    Spouse name: Not on file   Number of children: Not on file   Years of education: Not on file   Highest education level: Not on file  Occupational History   Not on file  Tobacco Use   Smoking status: Never   Smokeless tobacco: Never  Substance and Sexual Activity   Alcohol use: No   Drug use: Yes    Types: Marijuana   Sexual activity: Not on file  Other Topics Concern   Not on file  Social History Narrative   Not on file   Social Determinants of Health   Financial Resource Strain: Not on file  Food Insecurity: Not on file  Transportation Needs: Not on file  Physical Activity: Not on file  Stress: Not on file  Social Connections: Not on file   Additional Social History:                         Sleep: Good  Appetite:  Good  Current Medications: Current Facility-Administered Medications  Medication Dose Route Frequency Provider Last Rate Last Admin   acetaminophen (TYLENOL) tablet 650 mg  650 mg Oral Q6H PRN Jaclyn Shaggyaylor, Cody W, PA-C       alum & mag hydroxide-simeth (MAALOX/MYLANTA) 200-200-20 MG/5ML suspension 30 mL  30 mL Oral Q4H PRN Jaclyn Shaggyaylor, Cody W, PA-C  feeding supplement (ENSURE ENLIVE / ENSURE PLUS) liquid 237 mL  237 mL Oral BID BM Laveda Abbe, NP   237 mL at 04/13/21 1559   hydrOXYzine (ATARAX/VISTARIL) tablet 25 mg  25 mg Oral TID PRN Jaclyn Shaggy, PA-C       OLANZapine zydis (ZYPREXA) disintegrating tablet 10 mg  10 mg Oral Q8H PRN Laveda Abbe, NP       And   LORazepam (ATIVAN) tablet 1 mg  1 mg Oral PRN Laveda Abbe, NP       magnesium hydroxide (MILK OF MAGNESIA) suspension 30 mL  30 mL Oral Daily PRN Ladona Ridgel, Cody W, PA-C       OLANZapine zydis (ZYPREXA) disintegrating tablet 15 mg  15 mg Oral QHS Lauro Franklin, MD       Or   OLANZapine (ZYPREXA) injection 5 mg  5 mg Intramuscular QHS Lauro Franklin, MD        traZODone (DESYREL) tablet 50 mg  50 mg Oral QHS PRN Jaclyn Shaggy, PA-C        Lab Results:  Results for orders placed or performed during the hospital encounter of 04/12/21 (from the past 48 hour(s))  ANA     Status: None   Collection Time: 04/13/21  6:33 PM  Result Value Ref Range   Anti Nuclear Antibody (ANA) Negative Negative    Comment: (NOTE) Performed At: [facility] [address] [place], [place] [id number] Jolene Schimke MD [id number]   Ceruloplasmin     Status: None   Collection Time: 04/13/21  6:33 PM  Result Value Ref Range   Ceruloplasmin 19.9 19.0 - 39.0 mg/dL    Comment: (NOTE) Performed At: [facility] [address] [place], [place] [id number] Jolene Schimke MD [id number]   RPR     Status: None   Collection Time: 04/13/21  6:33 PM  Result Value Ref Range   RPR Ser Ql NON REACTIVE NON REACTIVE    Comment: Performed at [facility], [address] [place], [place] [address]  Sedimentation rate     Status: None   Collection Time: 04/13/21  6:33 PM  Result Value Ref Range   Sed Rate 6 0 - 22 mm/hr    Comment: Performed at [facility], [address] [place], [place] [address]  Vitamin B12     Status: None   Collection Time: 04/13/21  6:33 PM  Result Value Ref Range   Vitamin B-12 329 180 - 914 pg/mL    Comment: (NOTE) This assay is not validated for testing neonatal or myeloproliferative syndrome specimens for Vitamin B12 levels. Performed at [facility], [address], [place], [place] [address]   Comprehensive metabolic panel     Status: Abnormal   Collection Time: 04/13/21  6:33 PM  Result Value Ref Range   Sodium 139 135 - 145 mmol/L   Potassium 4.6 3.5 - 5.1 mmol/L   Chloride 105 98 - 111 mmol/L   CO2 24 22 - 32 mmol/L   Glucose, Bld 130 (H) 70 - 99 mg/dL    Comment: Glucose reference range applies only to samples taken after fasting for at least 8 hours.   BUN 16 6 -  20 mg/dL   Creatinine, Ser 7.78 0.44 - 1.00 mg/dL   Calcium 24.2 8.9 - 35.3 mg/dL   Total Protein 8.1 6.5 - 8.1 g/dL   Albumin 5.0 3.5 - 5.0 g/dL   AST 23  15 - 41 U/L   ALT 13 0 - 44 U/L   Alkaline Phosphatase 55 38 - 126 U/L   Total Bilirubin 0.6 0.3 - 1.2 mg/dL   GFR, Estimated >61 >60 mL/min    Comment: (NOTE) Calculated using the CKD-EPI Creatinine Equation (2021)    Anion gap 10 5 - 15    Comment: Performed at [facility], [address] [place], [place] [address]    Blood Alcohol level:  Lab Results  Component Value Date   ETH <10 12/05/2019   ETH <10 10/13/2017    Metabolic Disorder Labs: Lab Results  Component Value Date   HGBA1C 5.4 04/12/2021   MPG 108.28 04/12/2021   No results found for: PROLACTIN Lab Results  Component Value Date   CHOL 201 (H) 04/12/2021   TRIG 58 04/12/2021   HDL 51 04/12/2021   CHOLHDL 3.9 04/12/2021   VLDL 12 04/12/2021   LDLCALC 138 (H) 04/12/2021    Physical Findings: AIMS: Facial and Oral Movements Muscles of Facial Expression: None, normal Lips and Perioral Area: None, normal Jaw: None, normal Tongue: None, normal,Extremity Movements Upper (arms, wrists, hands, fingers): None, normal Lower (legs, knees, ankles, toes): None, normal, Trunk Movements Neck, shoulders, hips: None, normal, Overall Severity Severity of abnormal movements (highest score from questions above): None, normal Incapacitation due to abnormal movements: None, normal Patient's awareness of abnormal movements (rate only patient's report): No Awareness, Dental Status Current problems with teeth and/or dentures?: No Does patient usually wear dentures?: No  CIWA:    COWS:     Musculoskeletal: Strength & Muscle Tone: within normal limits Gait & Station: normal, steady Patient leans: N/A  Psychiatric Specialty Exam:  Presentation  General Appearance: Appropriate for Environment; Casual; Fairly Groomed  Eye  Contact:Minimal  Speech:Clear and Coherent (only answered in short sentences, only spoke when answering a question)  Speech Volume:loud at times  Handedness:Right   Mood and Affect  Mood:Labile; Irritable; Angry; Anxious  Affect:Labile   Thought Process  Thought Processes:Disorganized  Descriptions of Associations:Loose  Orientation:Full (Time, Place and Person)  Thought Content:Has appeared to be responding to internal stimuli per staff; is paranoid on exam; denies ideas of reference or AVH  History of Schizophrenia/Schizoaffective disorder:No  Duration of Psychotic Symptoms:Less than six months  Hallucinations:Denied but has been seen responding to internal stimuli  Ideas of Reference:None  Suicidal Thoughts:Suicidal Thoughts: No  Homicidal Thoughts:Homicidal Thoughts: No   Sensorium  Memory:would not cooperate for questioning  Judgment:Impaired  Insight:Lacking   Executive Functions  Concentration:Poor  Attention Span:Poor  Recall:Poor  Fund of Knowledge:Fair  Language:Good   Psychomotor Activity  Psychomotor Activity:Psychomotor Activity: Restlessness; Increased   Assets  Assets:Communication Skills; Physical Health; Resilience; Social Support   Sleep  Sleep:Sleep: Good Number of Hours of Sleep: 7.5    Physical Exam: Physical Exam Vitals and nursing note reviewed.  Constitutional:      General: She is not in acute distress.    Appearance: Normal appearance. She is normal weight. She is not ill-appearing or toxic-appearing.  HENT:     Head: Normocephalic and atraumatic.  Pulmonary:     Effort: Pulmonary effort is normal.  Musculoskeletal:        General: Normal range of motion.  Neurological:     General: No focal deficit present.     Mental Status: She is alert.   Review of Systems  Respiratory:  Negative for cough and shortness of breath.   Cardiovascular:  Negative for chest pain.  Gastrointestinal:  Negative for  abdominal pain, constipation, diarrhea, nausea and vomiting.  Neurological:  Negative for weakness and headaches.  Psychiatric/Behavioral:  Negative for depression, hallucinations and suicidal ideas. The patient is not nervous/anxious.   Blood pressure 128/74, pulse 95, temperature 98.4 F (36.9 C), temperature source Oral, resp. rate 17, height 5\' 7"  (1.702 m), weight 54.4 kg, SpO2 100 %. Body mass index is 18.79 kg/m.   Treatment Plan Summary: Daily contact with patient to assess and evaluate symptoms and progress in treatment   [name] is a 20 yr old female who presents under IVC for paranoia and delusional thoughts. PPHx is significant for previous IVC in March 2021 due to aggression.   She continues to deny the need to be in the facility, however, she continues to be combative and disruptive to the unit pacing up and down the hallways having arguments with people not there.  So far she has been redirectable.  Last night she did take p.o. medication but will increase it tonight again.  We will continue to monitor with forced medication protocol.   Acute Psychosis: -Increase Zyprexa Zydis to 15 mg PO or Zyprexa IM 5 mg if refuses PO -Continue Agitation Protocol:Zyprexa/Ativan/Geodon - Head CT pending when patient can cooperate for neuroimaging   Hypokalemia(resolved):     Nutritional support: -Continue Ensure BID between meals     -Continue PRN's: Tylenol, Maalox, Atarax, Milk of Magnesia, Trazodone     April 2021, MD 04/15/2021, 3:00 PM

## 2021-04-15 NOTE — Progress Notes (Signed)
Progress note  Pt found in bed; allowed to rest. Pt continues to be confrontational and angry/agitated in affect. Pt has been see in the hallway pacing. Pt can be demanding at times. Pt seems to be responding to voices, arguing when people are around them but nonsensical in content to the situation. Pt can be reclusive as well, and be heard from the hall. Pt continues to deny the need to be here and the need for medication. Pt is still denying avh at this time. Pt denies si/hi/ah/vh and verbally agrees to approach staff if these become apparent or before harming themselves/others while at [facility].  A: Pt provided support and encouragement. Pt given medication per protocol and standing orders. Q5m safety checks implemented and continued.  R: Pt safe on the unit. Will continue to monitor.

## 2021-04-15 NOTE — [facility] Suicide Risk Assessment (Signed)
[facility] INPATIENT:  Family/Significant Other Suicide Prevention Education  Suicide Prevention Education:  Patient Refusal for Family/Significant Other Suicide Prevention Education: The patient Monica Richardson has refused to provide written consent for family/significant other to be provided Family/Significant Other Suicide Prevention Education during admission and/or prior to discharge.  Physician notified.    [employer] A Shirlette Scarber 04/15/2021, 10:57 AM

## 2021-04-15 NOTE — [facility] Group Notes (Signed)
Due to the acuity, staffing and complex discharge plans, group was not held. Patient was provided therapeutic worksheets and asked to meet with CSW as needed.   Massiah Longanecker, LCSWA Clinicial Social Worker [facility]  

## 2021-04-15 NOTE — [facility] Counselor (Signed)
Adult Comprehensive Assessment  Patient ID: Monica Richardson, female   DOB: 2000-10-01, 20 y.o.   MRN: [id number]  Information Source: Information source: Patient  Current Stressors:  Patient states their primary concerns and needs for treatment are:: Pt states she is unsure why she was admitted Patient states their goals for this hospitilization and ongoing recovery are:: "To go home" Educational / Learning stressors: Pt states she is trying to enroll in school Employment / Job issues: States she had to quit her job at [employer] 2 weeks ago due no longer having transportation Family Relationships: Denies Metallurgist / Lack of resources (include bankruptcy): Denies stressor Housing / Lack of housing: Denies stressor Physical health (include injuries & life threatening diseases): Denies stressor Social relationships: Denies stressor Substance abuse: Denies stressor Bereavement / Loss: Denies stressor  Living/Environment/Situation:  Living Arrangements: Other relatives Living conditions (as described by patient or guardian): Lives with cousin Who else lives in the home?: Aunt, cousin and his children How long has patient lived in current situation?: 2 months What is atmosphere in current home: Comfortable, Supportive  Family History:  Marital status: Single Are you sexually active?: No What is your sexual orientation?: UTA Has your sexual activity been affected by drugs, alcohol, medication, or emotional stress?: Denies Does patient have children?: No  Childhood History:  By whom was/is the patient raised?: Other (Comment) (Aunt) Additional childhood history information: "It was fun" Description of patient's relationship with caregiver when they were a child: "Really close" Patient's description of current relationship with people who raised him/her: "Good" How were you disciplined when you got in trouble as a child/adolescent?: "Time out, sent to the corner" Does patient  have siblings?: Yes Number of Siblings: 2 Description of patient's current relationship with siblings: Has 2 older brothers and states they are much older than her Did patient suffer any verbal/emotional/physical/sexual abuse as a child?: No Did patient suffer from severe childhood neglect?: No Has patient ever been sexually abused/assaulted/raped as an adolescent or adult?: No Was the patient ever a victim of a crime or a disaster?: No Witnessed domestic violence?: No Has patient been affected by domestic violence as an adult?: No  Education:  Highest grade of school patient has completed: Some college Currently a Consulting civil engineer?: No Learning disability?: No  Employment/Work Situation:   Employment Situation: Unemployed Patient's Job has Been Impacted by Current Illness: No What is the Longest Time Patient has Held a Job?: 2 years 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?: No  Financial Resources:   Financial resources: No income Does patient have a Lawyer or guardian?: No  Alcohol/Substance Abuse:   What has been your use of drugs/alcohol within the last 12 months?: States she was using THC, however reports she stopped 3 weeks ago If attempted suicide, did drugs/alcohol play a role in this?: No Alcohol/Substance Abuse Treatment Hx: Denies past history Has alcohol/substance abuse ever caused legal problems?: No  Social Support System:   Patient's Community Support System: Good Describe Community Support System: Aunt, self Type of faith/religion: Ephriam Knuckles How does patient's faith help to cope with current illness?: "It helps me the most"  Leisure/Recreation:   Do You Have Hobbies?: Yes Leisure and Hobbies: "Adriana Simas, cleaning, hang out with friends"  Strengths/Needs:   What is the patient's perception of their strengths?: "Good friend,  not easlity distracted, know my limits" Patient states they can use these  personal strengths during their treatment  to contribute to their recovery: T ostay focused Patient states these barriers may affect/interfere with their treatment: None Patient states these barriers may affect their return to the community: None Other important information patient would like considered in planning for their treatment: None  Discharge Plan:   Currently receiving community mental health services: No Patient states concerns and preferences for aftercare planning are: Pt states she does not need services. Patient states they will know when they are safe and ready for discharge when: Yes, feels ready now Does patient have access to transportation?: Yes Does patient have financial barriers related to discharge medications?: No Patient description of barriers related to discharge medications: n/a Will patient be returning to same living situation after discharge?: Yes  Summary/Recommendations:   Summary and Recommendations (to be completed by the evaluator): Monica Richardson was admitted due to paranoia and delusional thought content. Recent stressors include trying to enroll in school and recently losing her employment. Pt currently sees no outpatient providers. While here, Monica Richardson can benefit from crisis stabilization, medication management, therapeutic milieu, and referrals for services.  Monica Richardson. 04/15/2021

## 2021-04-15 NOTE — Progress Notes (Signed)
Pt provided urine specimen cup. Waiting for sample collection.

## 2021-04-15 NOTE — Progress Notes (Signed)
Monica Richardson's mother [name] [phone]), called for update on her daughters condition. Her mother is on the visitation form to be able to communicate.  Reviewed medications and informed her that [name] took her hs medications without difficulty.  She is requesting to speak with Social worker and MD to provide collateral information regarding her treatment.

## 2021-04-15 NOTE — Progress Notes (Signed)
   04/15/21 2120  Psych Admission Type (Psych Patients Only)  Admission Status Involuntary  Psychosocial Assessment  Patient Complaints Anxiety;Restlessness  Eye Contact Brief  Facial Expression Anxious;Pensive  Affect Anxious;Irritable;Preoccupied  Speech Logical/coherent  Interaction Avoidant;Cautious;Guarded;Minimal  Motor Activity Slow  Appearance/Hygiene In scrubs  Behavior Characteristics Anxious  Mood Anxious;Suspicious  Aggressive Behavior  Targets Other (Comment) (staff)  Type of Behavior Verbal;Unprovoked  Effect No apparent injury  Thought Administrator, sports thinking  Content Blaming others  Delusions Paranoid;Persecutory  Perception Hallucinations  Hallucination Auditory  Judgment Poor  Confusion None  Danger to Self  Current suicidal ideation? Denies  Danger to Others  Danger to Others None reported or observed  Danger to Others Abnormal  Harmful Behavior to others Threats of violence towards other people observed or expressed   Destructive Behavior No threats or harm toward property

## 2021-04-16 DIAGNOSIS — F29 Unspecified psychosis not due to a substance or known physiological condition: Secondary | ICD-10-CM | POA: Diagnosis not present

## 2021-04-16 MED ORDER — OLANZAPINE 5 MG PO TBDP: 5.0000 mg | ORAL_TABLET | Freq: Three times a day (TID) | ORAL | Status: DC | PRN

## 2021-04-16 MED ORDER — OLANZAPINE 5 MG PO TBDP
5.0000 mg | ORAL_TABLET | Freq: Three times a day (TID) | ORAL | Status: DC | PRN
  Filled 2021-04-16: qty 1

## 2021-04-16 MED ORDER — OLANZAPINE 5 MG PO TBDP
15.0000 mg | ORAL_TABLET | Freq: Every day | ORAL | Status: DC
  Administered 2021-04-16 – 2021-04-20 (×4): 15 mg via ORAL
  Filled 2021-04-16 (×7): qty 1

## 2021-04-16 MED ORDER — OLANZAPINE 10 MG IM SOLR
5.0000 mg | Freq: Every day | INTRAMUSCULAR | Status: DC
  Administered 2021-04-17 – 2021-04-20 (×3): 5 mg via INTRAMUSCULAR
  Filled 2021-04-16 (×6): qty 10

## 2021-04-16 MED ORDER — OLANZAPINE 10 MG IM SOLR
5.0000 mg | Freq: Every day | INTRAMUSCULAR | Status: DC
  Filled 2021-04-16 (×2): qty 10

## 2021-04-16 MED ORDER — ZIPRASIDONE MESYLATE 20 MG IM SOLR: 20.0000 mg | INTRAMUSCULAR | Status: DC | PRN

## 2021-04-16 MED ORDER — OLANZAPINE 5 MG PO TABS
5.0000 mg | ORAL_TABLET | Freq: Every day | ORAL | Status: DC
  Filled 2021-04-16 (×3): qty 1

## 2021-04-16 MED ORDER — OLANZAPINE 10 MG IM SOLR
INTRAMUSCULAR | Status: AC
  Filled 2021-04-16: qty 10

## 2021-04-16 MED ORDER — OLANZAPINE 5 MG PO TBDP
5.0000 mg | ORAL_TABLET | Freq: Every day | ORAL | Status: DC
  Filled 2021-04-16 (×2): qty 1

## 2021-04-16 MED ORDER — LORAZEPAM 1 MG PO TABS: 1.0000 mg | ORAL_TABLET | ORAL | Status: DC | PRN

## 2021-04-16 MED ORDER — DIPHENHYDRAMINE HCL 25 MG PO CAPS
25.0000 mg | ORAL_CAPSULE | Freq: Four times a day (QID) | ORAL | Status: DC | PRN
  Filled 2021-04-16 (×2): qty 1

## 2021-04-16 MED ORDER — DIPHENHYDRAMINE HCL 50 MG/ML IJ SOLN
25.0000 mg | Freq: Four times a day (QID) | INTRAMUSCULAR | Status: DC | PRN
  Administered 2021-04-16 – 2021-04-17 (×2): 25 mg via INTRAMUSCULAR
  Filled 2021-04-16 (×2): qty 1

## 2021-04-16 MED ORDER — OLANZAPINE 10 MG IM SOLR
5.0000 mg | Freq: Four times a day (QID) | INTRAMUSCULAR | Status: DC | PRN
  Administered 2021-04-16: 5 mg via INTRAMUSCULAR

## 2021-04-16 MED ORDER — OLANZAPINE 10 MG IM SOLR
5.0000 mg | Freq: Every day | INTRAMUSCULAR | Status: DC
  Filled 2021-04-16 (×3): qty 10

## 2021-04-16 MED ORDER — OLANZAPINE 5 MG PO TBDP
5.0000 mg | ORAL_TABLET | Freq: Every day | ORAL | Status: DC
  Administered 2021-04-19: 5 mg via ORAL
  Filled 2021-04-16 (×7): qty 1

## 2021-04-16 MED ORDER — OLANZAPINE 10 MG IM SOLR
5.0000 mg | Freq: Every day | INTRAMUSCULAR | Status: DC
  Filled 2021-04-16 (×6): qty 10

## 2021-04-16 NOTE — Progress Notes (Addendum)
[facility] MD Progress Note  04/16/2021 6:39 PM Monica Richardson  MRN:  [id number] Subjective:   Monica Richardson is a 20 yr old female who presents under IVC for paranoia and delusional thoughts. PPHx is significant for previous IVC in March 2021 due to aggression.  While attempting to interview patient today Pt lying on the bed.  Patient does not want to go out off her room to talk.  Patient closed her eyes and nodded head for yes or no.  Patient talked in minimal words.  Patient is guarded and irritable.  She will not answer when asked about recent persecutory delusions. She states her mood is "okay." States she has been eating and sleeping okay.  Denies SI, HI, and auditory and visual hallucinations.  Patient denies paranoia. Pt denies any headache, nausea, vomiting, dizziness, chest pain, SOB, abdominal pain, diarrhea, and constipation. Pt denies any medication side effects and has been tolerating it well. Pt denies any concerns.     RN reported that patient was irritable banging the door earlier today, flooded her room, was angry at staff.  Patient received extra dose of Zyprexa IM 5 mg with Benadryl 25mg  IM for acute agitation.  Patient has been irritable and did not attend groups.   Principal Problem: Schizophrenia spectrum disorder with psychotic disorder type not yet determined (HCC) Diagnosis: Principal Problem:   Schizophrenia spectrum disorder with psychotic disorder type not yet determined [facility]) Active Problems:   Aggressive behavior   Cannabis abuse  Total Time spent with patient: 30 minutes  Past Psychiatric History: IVC in March 2021 due to aggression  Past Medical History: History reviewed. No pertinent past medical history. History reviewed. No pertinent surgical history. Family History: History reviewed. No pertinent family history. Family Psychiatric  History: None Social History:  Social History   Substance and Sexual Activity  Alcohol Use No     Social History   Substance and  Sexual Activity  Drug Use Yes   Types: Marijuana    Social History   Socioeconomic History   Marital status: Single    Spouse name: Not on file   Number of children: Not on file   Years of education: Not on file   Highest education level: Not on file  Occupational History   Not on file  Tobacco Use   Smoking status: Never   Smokeless tobacco: Never  Substance and Sexual Activity   Alcohol use: No   Drug use: Yes    Types: Marijuana   Sexual activity: Not on file  Other Topics Concern   Not on file  Social History Narrative   Not on file   Social Determinants of Health   Financial Resource Strain: Not on file  Food Insecurity: Not on file  Transportation Needs: Not on file  Physical Activity: Not on file  Stress: Not on file  Social Connections: Not on file   Sleep: Good  Appetite:  Good  Current Medications: Current Facility-Administered Medications  Medication Dose Route Frequency Provider Last Rate Last Admin   acetaminophen (TYLENOL) tablet 650 mg  650 mg Oral Q6H PRN April 2021, PA-C       alum & mag hydroxide-simeth (MAALOX/MYLANTA) 200-200-20 MG/5ML suspension 30 mL  30 mL Oral Q4H PRN 03-20-2001 W, PA-C       diphenhydrAMINE (BENADRYL) capsule 25 mg  25 mg Oral Q6H PRN M, Artur Winningham E, MD       Or   diphenhydrAMINE (BENADRYL) injection 25 mg  25 mg Intramuscular  Q6H PRN Comer Locket, MD   25 mg at 04/16/21 1126   feeding supplement (ENSURE ENLIVE / ENSURE PLUS) liquid 237 mL  237 mL Oral BID BM Laveda Abbe, NP   237 mL at 04/16/21 1521   hydrOXYzine (ATARAX/VISTARIL) tablet 25 mg  25 mg Oral TID PRN Jaclyn Shaggy, PA-C   25 mg at 04/16/21 1031   OLANZapine zydis (ZYPREXA) disintegrating tablet 5 mg  5 mg Oral Q8H PRN Comer Locket, MD       And   LORazepam (ATIVAN) tablet 1 mg  1 mg Oral PRN Comer Locket, MD       magnesium hydroxide (MILK OF MAGNESIA) suspension 30 mL  30 mL Oral Daily PRN Ladona Ridgel, Cody W, PA-C       OLANZapine  zydis (ZYPREXA) disintegrating tablet 15 mg  15 mg Oral QHS Lauro Franklin, MD   15 mg at 04/15/21 2112   Or   OLANZapine (ZYPREXA) injection 5 mg  5 mg Intramuscular QHS Lauro Franklin, MD       OLANZapine (ZYPREXA) tablet 5 mg  5 mg Oral Daily Mason Jim, Tinaya Ceballos E, MD       Or   OLANZapine (ZYPREXA) injection 5 mg  5 mg Intramuscular Daily Mason Jim, Ashawna Hanback E, MD       OLANZapine (ZYPREXA) injection 5 mg  5 mg Intramuscular Q6H PRN Bartholomew Crews E, MD   5 mg at 04/16/21 1126   traZODone (DESYREL) tablet 50 mg  50 mg Oral QHS PRN Jaclyn Shaggy, PA-C        Lab Results:  No results found for this or any previous visit (from the past 48 hour(s)).   Blood Alcohol level:  Lab Results  Component Value Date   ETH <10 12/05/2019   ETH <10 10/13/2017    Metabolic Disorder Labs: Lab Results  Component Value Date   HGBA1C 5.4 04/12/2021   MPG 108.28 04/12/2021   No results found for: PROLACTIN Lab Results  Component Value Date   CHOL 201 (H) 04/12/2021   TRIG 58 04/12/2021   HDL 51 04/12/2021   CHOLHDL 3.9 04/12/2021   VLDL 12 04/12/2021   LDLCALC 138 (H) 04/12/2021    Physical Findings: AIMS: Facial and Oral Movements Muscles of Facial Expression: None, normal Lips and Perioral Area: None, normal Jaw: None, normal Tongue: None, normal,Extremity Movements Upper (arms, wrists, hands, fingers): None, normal Lower (legs, knees, ankles, toes): None, normal, Trunk Movements Neck, shoulders, hips: None, normal, Overall Severity Severity of abnormal movements (highest score from questions above): None, normal Incapacitation due to abnormal movements: None, normal Patient's awareness of abnormal movements (rate only patient's report): No Awareness, Dental Status Current problems with teeth and/or dentures?: No Does patient usually wear dentures?: No    Musculoskeletal: Strength & Muscle Tone: untested in bed  Gait & Station: untested in bed Patient leans:  N/A  Psychiatric Specialty Exam:  Presentation  General Appearance: Appropriate for Environment; Casual  Eye Contact:Minimal  Speech: minimal -Answers in short phrases but refusing to speak for most of exam  Speech Volume: Loud at times  Handedness:Right   Mood and Affect  Mood:Labile, irritable, hostile, argumentative  Affect:Labile; Congruent,    Thought Process  Thought Processes:Disorganized  Orientation:will not cooperate for questions  Thought Content:Denies AVH or paranoia and will not comment when asked about recent delusions- appears paranoid and guarded on exam; is not grossly responding to internal/external stimuli on exam  History of Schizophrenia/Schizoaffective  disorder:No  Duration of Psychotic Symptoms:Less than six months  Hallucinations:Denied  Ideas of Reference:None  Suicidal Thoughts:Suicidal Thoughts: No  Homicidal Thoughts:Homicidal Thoughts: No   Sensorium  Memory:would not cooperate for questioning  Judgment:Impaired  Insight:Lacking   Executive Functions  Concentration:Poor  Attention Span:Poor  Recall:Poor  Fund of Knowledge:Fair  Language:Poor   Psychomotor Activity  Psychomotor Activity: Public affairs consultant  Assets:Resilience, social supports   Sleep  Sleep:Sleep: Good Number of Hours of Sleep: 7.5    Physical Exam: Physical Exam Vitals and nursing note reviewed.  Constitutional:      General: She is not in acute distress.    Appearance: Normal appearance. She is normal weight. She is not ill-appearing or toxic-appearing.  HENT:     Head: Normocephalic and atraumatic.  Pulmonary:     Effort: Pulmonary effort is normal.  Musculoskeletal:        General: Normal range of motion.  Neurological:     General: No focal deficit present.     Mental Status: She is alert.   Review of Systems  Respiratory:  Negative for cough and shortness of breath.   Cardiovascular:  Negative for chest pain.   Gastrointestinal:  Negative for abdominal pain, constipation, diarrhea, nausea and vomiting.  Neurological:  Negative for weakness and headaches.  Psychiatric/Behavioral:  Negative for depression, hallucinations and suicidal ideas. The patient is not nervous/anxious.   Blood pressure 128/74, pulse 95, temperature 98.4 F (36.9 C), temperature source Oral, resp. rate 17, height 5\' 7"  (1.702 m), weight 54.4 kg, SpO2 100 %. Body mass index is 18.79 kg/m.   Treatment Plan Summary: Daily contact with patient to assess and evaluate symptoms and progress in treatment   Monica Arrey is a 20 yr old female who presents under IVC for paranoia and delusional thoughts. PPHx is significant for previous IVC in March 2021 due to aggression.   Acute Psychosis: -Continue forced med orders -Continue Zyprexa Zydis 15 mg PO qhs and start 5mg  po q am with Zyprexa IM 5 mg if refuses PO doses - Zyprexa zydis agitation protocol PRN -Start Benadryl 25 mg IM or PO every 6 hours as needed for agitation - Head CT pending when patient can cooperate for neuroimaging-patient is volatile now and high risk for elopement.   Hypokalemia(resolved):     Nutritional support: -Continue Ensure BID between meals     -Continue PRN's: Tylenol, Maalox, Atarax, Milk of Magnesia, Trazodone     April 2021, MD PGY2 04/16/2021, 6:39 PM

## 2021-04-16 NOTE — Progress Notes (Signed)
Patient has been isolative to her room tonight even when asked several times to come to medication window to take her medication she never came. Writer and mht went to her room to give medication. She would not talk with Clinical research associate and seemed angry. Writer asked to see if medication dissolved and she did not cooperate opening her mouth. Patient is safe in her room resting.

## 2021-04-16 NOTE — [facility] Group Notes (Signed)
Group therapy was not held due to high acuity on the unit, almost nobody able to attend.  Ambrose Mantle, LCSW 04/16/2021, 3:33 PM

## 2021-04-16 NOTE — [facility] Group Notes (Signed)
PATIENT DID NOT ATTEND GROUP   SPIRITUALITY GROUP NOTE   Spirituality group facilitated by Chaplain Katy Jacinta Penalver, [facility].   Group Description: Group focused on topic of hope. Patients participated in facilitated discussion around topic, connecting with one another around experiences and definitions for hope. Group members engaged with visual explorer photos, reflecting on what hope looks like for them today. Group engaged in discussion around how their definitions of hope are present today in hospital.   Modalities: Psycho-social ed, Adlerian, Narrative, MI    

## 2021-04-16 NOTE — Progress Notes (Signed)
Adult Psychoeducational Group Note  Date:  04/16/2021 Time:  10:51 PM  Group Topic/Focus:  Wrap-Up Group:   The focus of this group is to help patients review their daily goal of treatment and discuss progress on daily workbooks.  Participation Level:  Did Not Attend  Participation Quality:   Did Not Attend  Affect:   Did Not Attend  Cognitive:   Did Not Attend  Insight: None  Engagement in Group:   Did Not Attend  Modes of Intervention:   Did Not Attend  Additional Comments:  Pt did not attend evening wrap up group tonight.  [name] 04/16/2021, 10:51 PM

## 2021-04-16 NOTE — Progress Notes (Addendum)
Pt presents with blunted affect and agitated on initial interactions. Noted banging walls in her room with a close fist, yelling, verbally abusive, demanding discharge and tearful at the time "Let me the fuck out of here, I want to go, let me go". Refused PRN medications via PO route when offered despite multiple encouragement "No, I'm not taking non of your fucking medicines, keep me here as long as you fucking want". Pt continued to escalate in her behavior by flooding her room with milk and water in protest to her discharge demands. Verbal redirections ineffective at the time. Pt denies SI, HI, AVH and pain when assessed.  Reports  Provider made aware of pt's behavior, new order received for Benadryl 25 mg and Zyprexa 5 mg both IM and was given at 1126. Emotional support and encouragement offered to pt this shift. Q 15 minutes safety checks maintained. All medications given as ordered with verbal education and effects monitored.  Pt asleep at 1225 when reassessed. Respirations noted and unlabored. Denies discomfort with medications, meals and fluids. Pt remains on unit restriction due to mood lability / agitation.

## 2021-04-17 DIAGNOSIS — R4689 Other symptoms and signs involving appearance and behavior: Secondary | ICD-10-CM | POA: Diagnosis not present

## 2021-04-17 DIAGNOSIS — F1210 Cannabis abuse, uncomplicated: Secondary | ICD-10-CM | POA: Diagnosis not present

## 2021-04-17 DIAGNOSIS — F29 Unspecified psychosis not due to a substance or known physiological condition: Secondary | ICD-10-CM | POA: Diagnosis not present

## 2021-04-17 MED ORDER — LORAZEPAM 2 MG/ML IJ SOLN: 1.0000 mg | Freq: Two times a day (BID) | INTRAMUSCULAR | Status: DC

## 2021-04-17 MED ORDER — LORAZEPAM 2 MG/ML IJ SOLN
1.0000 mg | Freq: Two times a day (BID) | INTRAMUSCULAR | Status: DC
  Administered 2021-04-17 – 2021-04-20 (×4): 1 mg via INTRAMUSCULAR
  Filled 2021-04-17 (×4): qty 1

## 2021-04-17 MED ORDER — DIVALPROEX SODIUM 500 MG PO DR TAB
500.0000 mg | DELAYED_RELEASE_TABLET | Freq: Two times a day (BID) | ORAL | Status: DC
  Filled 2021-04-17 (×2): qty 1

## 2021-04-17 MED ORDER — DIVALPROEX SODIUM 500 MG PO DR TAB
500.0000 mg | DELAYED_RELEASE_TABLET | Freq: Two times a day (BID) | ORAL | Status: DC
  Administered 2021-04-19 – 2021-04-21 (×4): 500 mg via ORAL
  Filled 2021-04-17 (×12): qty 1

## 2021-04-17 MED ORDER — DIVALPROEX SODIUM 500 MG PO DR TAB
500.0000 mg | DELAYED_RELEASE_TABLET | Freq: Two times a day (BID) | ORAL | Status: DC
  Filled 2021-04-17 (×4): qty 1

## 2021-04-17 NOTE — Progress Notes (Signed)
Pt visible in bed on initial interaction. Presents irritable with blunted affect, elective mutism and avertive eye contact. Denies SI, HI, AVH and pain when assessed "no, I just want to leave like I told you". Observed to be tangential with loose association as well "I'm just lit, don't blame me for your afterlife. Don't be jealous of me because I'm lit, I'm just living my life and y'all want to fucking destroy that". Pt continue to refuse all PO medications when offered "I keep telling y'all I don't need this shit, Y'all are messing with my life".  Attempted to walk away from medication window with medication in the cup, refused to come back. Argumentative and verbally aggressive with staff when verbal redirection was attempted. Visible in milieu at brief interval at the beginning of this shift, was very disruptive in group, talking over staff and walking in and out of group session. Safety checks maintained at Q 15 minutes intervals without self harm gestures. Support and encouragement offered to foster compliance with current regimen. Pt remains isolative to room majority of this shift without self harm gestures to note thus far.

## 2021-04-17 NOTE — Progress Notes (Signed)
Adult Psychoeducational Group Note  Date:  04/17/2021 Time:  9:31 PM  Group Topic/Focus:  Wrap-Up Group:   The focus of this group is to help patients review their daily goal of treatment and discuss progress on daily workbooks.  Participation Level:  Did Not Attend  Participation Quality: Not Applicable  Affect: Not Applicable  Cognitive: Not Applicable  Insight: None  Engagement in Group: Not Applicable  Modes of Intervention: Not Applicable  Additional Comments: The patient did not attend group this evening.  Nicoletta Dress 04/17/2021, 9:31 PM

## 2021-04-17 NOTE — [facility] Group Notes (Signed)
[facility] LCSW Group Therapy Note  Date/Time:  04/17/2021  11:00AM-12:00PM  Type of Therapy and Topic:  Group Therapy:  Music and Mood  Participation Level:  None   Description of Group: In this process group, members listened to a variety of genres of music and identified that different types of music evoke different responses.  Patients were encouraged to identify music that was soothing for them and music that was energizing for them.  Patients discussed how this knowledge can help with wellness and recovery in various ways including managing depression and anxiety as well as encouraging healthy sleep habits.    Therapeutic Goals: Patients will explore the impact of different varieties of music on mood Patients will verbalize the thoughts they have when listening to different types of music Patients will identify music that is soothing to them as well as music that is energizing to them Patients will discuss how to use this knowledge to assist in maintaining wellness and recovery Patients will explore the use of music as a coping skill  Summary of Patient Progress:  At the beginning of group, patient was not present.  She came in abruptly around 11:40, making it apparent that she was irritated.  When asked her name, she refused to share, saying "I don't have a name."  She emitted disgusted sounds throughout the remainder of group, did not talk about how any of the songs made her feel, abruptly left 2 minutes prior to group end.  Therapeutic Modalities: Solution Focused Brief Therapy Activity   Ambrose Mantle, LCSW

## 2021-04-17 NOTE — Progress Notes (Signed)
[facility] MD Progress Note  04/17/2021 11:19 AM Monica Richardson  MRN:  [id number] Subjective:   [name] is a 20 yr old female who presents under IVC for paranoia and delusional thoughts. PPHx is significant for previous IVC in March 2021 due to aggression.  Evaluation on the unit: Patient was seen and evaluated in her room. She was lying on the bed. Patient talked in minimal words in a soft voice.  Patient is guarded, angry and irritable. She denies depression and anxiety. States she has been eating and sleeping okay. Record reflects she slept 6.25 hours last night. Denies SI, HI, and auditory and visual hallucinations. Gwendola stated she feels this is a waste of time and she does not want to take medications. She stated "I have a life to live and I just want to leave." Reminded patient the quickest way to leave is to engage in treatment, which means taking medications for her mood. She stated "I don't have a mood."  Pt denies any medication side effects and has been tolerating them well. Patient will be started on Depakote 500 mg PO BID and may have Ativan 1 mg IM if she refuses to take the Depakote. Will continue to monitor.    RN reported that patient was irritable banging the counter earlier today because she refused to take her PO Zyprexa and was given an IM injection of Zyprexa.  She received an IM Benadryl for agitation after she started banging her fist on the nurses station. Patient received extra dose of Zyprexa IM 5 mg with Benadryl 25mg  IM for acute agitation. Patient has been irritable and did not attend groups.   Principal Problem: Schizophrenia spectrum disorder with psychotic disorder type not yet determined (HCC) Diagnosis: Principal Problem:   Schizophrenia spectrum disorder with psychotic disorder type not yet determined [facility]) Active Problems:   Aggressive behavior   Cannabis abuse  Total Time spent with patient: 30 minutes  Past Psychiatric History: IVC in March 2021 due to  aggression  Past Medical History: History reviewed. No pertinent past medical history. History reviewed. No pertinent surgical history. Family History: History reviewed. No pertinent family history. Family Psychiatric  History: None Social History:  Social History   Substance and Sexual Activity  Alcohol Use No     Social History   Substance and Sexual Activity  Drug Use Yes   Types: Marijuana    Social History   Socioeconomic History   Marital status: Single    Spouse name: Not on file   Number of children: Not on file   Years of education: Not on file   Highest education level: Not on file  Occupational History   Not on file  Tobacco Use   Smoking status: Never   Smokeless tobacco: Never  Substance and Sexual Activity   Alcohol use: No   Drug use: Yes    Types: Marijuana   Sexual activity: Not on file  Other Topics Concern   Not on file  Social History Narrative   Not on file   Social Determinants of Health   Financial Resource Strain: Not on file  Food Insecurity: Not on file  Transportation Needs: Not on file  Physical Activity: Not on file  Stress: Not on file  Social Connections: Not on file   Sleep: Good  Appetite:  Good  Current Medications: Current Facility-Administered Medications  Medication Dose Route Frequency Provider Last Rate Last Admin   acetaminophen (TYLENOL) tablet 650 mg  650 mg Oral Q6H PRN  Jaclyn Shaggy, PA-C       alum & mag hydroxide-simeth (MAALOX/MYLANTA) 200-200-20 MG/5ML suspension 30 mL  30 mL Oral Q4H PRN Melbourne Abts W, PA-C       diphenhydrAMINE (BENADRYL) capsule 25 mg  25 mg Oral Q6H PRN Comer Locket, MD       Or   diphenhydrAMINE (BENADRYL) injection 25 mg  25 mg Intramuscular Q6H PRN Comer Locket, MD   25 mg at 04/16/21 1126   feeding supplement (ENSURE ENLIVE / ENSURE PLUS) liquid 237 mL  237 mL Oral BID BM Laveda Abbe, NP   237 mL at 04/16/21 1521   OLANZapine zydis (ZYPREXA) disintegrating tablet  5 mg  5 mg Oral Q8H PRN Comer Locket, MD       And   LORazepam (ATIVAN) tablet 1 mg  1 mg Oral PRN Comer Locket, MD       And   ziprasidone (GEODON) injection 20 mg  20 mg Intramuscular PRN Mason Jim, Amy E, MD       magnesium hydroxide (MILK OF MAGNESIA) suspension 30 mL  30 mL Oral Daily PRN Ladona Ridgel, Cody W, PA-C       OLANZapine zydis (ZYPREXA) disintegrating tablet 15 mg  15 mg Oral QHS Mason Jim, Amy E, MD   15 mg at 04/16/21 2139   Or   OLANZapine (ZYPREXA) injection 5 mg  5 mg Intramuscular QHS Mason Jim, Amy E, MD       OLANZapine zydis (ZYPREXA) disintegrating tablet 5 mg  5 mg Oral Daily Mason Jim, Amy E, MD   5 mg at 04/17/21 1118   Or   OLANZapine (ZYPREXA) injection 5 mg  5 mg Intramuscular Daily Mason Jim, Amy E, MD       traZODone (DESYREL) tablet 50 mg  50 mg Oral QHS PRN Jaclyn Shaggy, PA-C        Lab Results:  No results found for this or any previous visit (from the past 48 hour(s)).   Blood Alcohol level:  Lab Results  Component Value Date   ETH <10 12/05/2019   ETH <10 10/13/2017    Metabolic Disorder Labs: Lab Results  Component Value Date   HGBA1C 5.4 04/12/2021   MPG 108.28 04/12/2021   No results found for: PROLACTIN Lab Results  Component Value Date   CHOL 201 (H) 04/12/2021   TRIG 58 04/12/2021   HDL 51 04/12/2021   CHOLHDL 3.9 04/12/2021   VLDL 12 04/12/2021   LDLCALC 138 (H) 04/12/2021    Physical Findings: AIMS: Facial and Oral Movements Muscles of Facial Expression: None, normal Lips and Perioral Area: None, normal Jaw: None, normal Tongue: None, normal,Extremity Movements Upper (arms, wrists, hands, fingers): None, normal Lower (legs, knees, ankles, toes): None, normal, Trunk Movements Neck, shoulders, hips: None, normal, Overall Severity Severity of abnormal movements (highest score from questions above): None, normal Incapacitation due to abnormal movements: None, normal Patient's awareness of abnormal movements (rate  only patient's report): No Awareness, Dental Status Current problems with teeth and/or dentures?: No Does patient usually wear dentures?: No    Musculoskeletal: Strength & Muscle Tone: untested in bed  Gait & Station: untested in bed Patient leans: N/A  Psychiatric Specialty Exam:  Presentation  General Appearance: Appropriate for Environment; Casual  Eye Contact:Minimal  Speech: minimal -Answers in short phrases but refusing to speak for most of exam  Speech Volume: Loud at times  Handedness:Right   Mood and Affect  Mood:Labile, irritable, hostile, argumentative  Affect:Labile;  Congruent ,    Thought Process  Thought Processes:Disorganized  Orientation:will not cooperate for questions  Thought Content:Denies AVH or paranoia and will not comment when asked about recent delusions- appears paranoid and guarded on exam; is not grossly responding to internal/external stimuli on exam  History of Schizophrenia/Schizoaffective disorder:No  Duration of Psychotic Symptoms:Less than six months  Hallucinations:Denied  Ideas of Reference:None  Suicidal Thoughts:Suicidal Thoughts: No  Homicidal Thoughts:Homicidal Thoughts: No   Sensorium  Memory:would not cooperate for questioning  Judgment:Impaired  Insight:Lacking   Executive Functions  Concentration:Poor  Attention Span:Poor  Recall:Poor  Fund of Knowledge:Fair  Language:Poor   Psychomotor Activity  Psychomotor Activity: Public affairs consultant  Assets:Resilience, social supports   Sleep  Sleep:Sleep: Good Number of Hours of Sleep: 7.5    Physical Exam: Physical Exam Vitals and nursing note reviewed.  Constitutional:      General: She is not in acute distress.    Appearance: Normal appearance. She is normal weight. She is not ill-appearing or toxic-appearing.  HENT:     Head: Normocephalic and atraumatic.  Pulmonary:     Effort: Pulmonary effort is normal.  Musculoskeletal:        General:  Normal range of motion.  Neurological:     General: No focal deficit present.     Mental Status: She is alert.   Review of Systems  Respiratory:  Negative for cough and shortness of breath.   Cardiovascular:  Negative for chest pain.  Gastrointestinal:  Negative for abdominal pain, constipation, diarrhea, nausea and vomiting.  Neurological:  Negative for weakness and headaches.  Psychiatric/Behavioral:  Negative for depression, hallucinations and suicidal ideas. The patient is not nervous/anxious.    Blood pressure (!) 126/94, pulse 60, temperature 97.7 F (36.5 C), temperature source Oral, resp. rate 17, height 5\' 7"  (1.702 m), weight 54.4 kg, SpO2 100 %. Body mass index is 18.79 kg/m.   Treatment Plan Summary: Daily contact with patient to assess and evaluate symptoms and progress in treatment   Miss Gallogly is a 20 yr old female who presents under IVC for paranoia and delusional thoughts. PPHx is significant for previous IVC in March 2021 due to aggression.   Acute Psychosis: -Continue forced med orders -Continue Zyprexa Zydis 15 mg PO qhs and start 5mg  po q am with Zyprexa IM 5 mg if refuses PO doses - Zyprexa zydis agitation protocol PRN -Start Benadryl 25 mg IM or PO every 6 hours as needed for agitation -Start Depakote 500 mg BID PO for mood stabilization -Patient may have Ativan 1 mg IM if she refuses to take Depakote.  - Head CT pending when patient can cooperate for neuroimaging-patient is volatile now and high risk for elopement.   Hypokalemia(resolved):     Nutritional support: -Continue Ensure BID between meals     -Continue PRN's: Tylenol, Maalox, Atarax, Milk of Magnesia, Trazodone     April 2021, NP  04/17/2021, 2:56 PM

## 2021-04-18 DIAGNOSIS — F29 Unspecified psychosis not due to a substance or known physiological condition: Secondary | ICD-10-CM | POA: Diagnosis not present

## 2021-04-18 NOTE — Progress Notes (Signed)
   04/18/21 2145  Psych Admission Type (Psych Patients Only)  Admission Status Involuntary  Psychosocial Assessment  Patient Complaints Irritability  Eye Contact Avoids  Facial Expression Anxious  Affect Irritable  Speech Argumentative;Elective mutism  Interaction Avoidant;Guarded;Isolative;No initiation  Motor Activity Slow  Appearance/Hygiene In scrubs  Behavior Characteristics Irritable  Mood Irritable  Thought Process  Coherency Concrete thinking;Tangential  Content Blaming others  Delusions None reported or observed  Perception Hallucinations  Hallucination None reported or observed  Judgment Poor  Confusion None  Danger to Self  Current suicidal ideation? Denies (refused to answer)  Danger to Others  Danger to Others None reported or observed   Pt seen in hallway on the phone. Asked to come to med window for meds. Pt stood by nurse's station even when called over. Pt came near med window. Pt asked to come closer to get meds. "Can't you see me here. I'm not coming any closer." Pt asked if she will take meds PO or get them IM. "Just give me the meds." Pt given nighttime Zyprexa. Started to leave and was asked to stand in place so the medication could begin to dissolve. Asked to open her mouth. Pt opened mouth with head angled upwards. Asked to lower her head and lift her tongue. Pt finally complied. Pill not seen in mouth. Followed back to room to ensure pt not going to bathroom or trash can to spit out any remaining med. Zydis likely dissolved already. Pt safe on unit. Refused to answer questions about SI.

## 2021-04-18 NOTE — Progress Notes (Signed)
Pt remains noncompliant with medications via PO route. Received all medications via IM route this shift. Isolative to room. Pt continues to be agitated, irritable with blunted affect, avertive eye contact and demanding d/c on interactions. Refused ensures, meals when offered despite multiple prompts. Denies SI, HI, AVH and pain when assessed "No, just let me leave alone". Pt tolerated medications and fluids well. OOB for fluids only at nursing station and back to bed. Emotional support and encouragement provided to pt this shift. Q 15 minutes safety checks maintained on and off unit without self harm gestures or outburst.  Pt remains safe on unit restriction due to mood lability.

## 2021-04-18 NOTE — Progress Notes (Addendum)
[facility] MD Progress Note  04/18/2021 12:08 PM Daneya Hartgrove  MRN:  [id number] Subjective:   Miss Hersman is a 20 yr old female who presents under IVC for paranoia and delusional thoughts. PPHx is significant for previous IVC in March 2021 due to aggression.   During today's interview patient remained in bed with covers pulled up close to her head.  She responded in short yes no answers to all questions.   She reports that she is doing fine today.  She reports that she slept well last night.  She reports that her appetite has improved.  She reports no SI, HI, or AVH.  Attempted to engage her in conversation about her behaviors over the weekend but she would not.  Encouraged her again to take her p.o. medications.  When asked if she had been compliant with her p.o. medications she would not give a straight answer.  Per [facility] she has had to receive IM injections for all medications.  She reports no other concerns at present.   Principal Problem: Schizophrenia spectrum disorder with psychotic disorder type not yet determined (HCC) Diagnosis: Principal Problem:   Schizophrenia spectrum disorder with psychotic disorder type not yet determined [facility]) Active Problems:   Aggressive behavior   Cannabis abuse  Total Time spent with patient: 30 minutes  Past Psychiatric History: IVC in March 2021 due to aggression  Past Medical History: History reviewed. No pertinent past medical history. History reviewed. No pertinent surgical history. Family History: History reviewed. No pertinent family history. Family Psychiatric  History: None Social History:  Social History   Substance and Sexual Activity  Alcohol Use No     Social History   Substance and Sexual Activity  Drug Use Yes   Types: Marijuana    Social History   Socioeconomic History   Marital status: Single    Spouse name: Not on file   Number of children: Not on file   Years of education: Not on file   Highest education level: Not on file   Occupational History   Not on file  Tobacco Use   Smoking status: Never   Smokeless tobacco: Never  Substance and Sexual Activity   Alcohol use: No   Drug use: Yes    Types: Marijuana   Sexual activity: Not on file  Other Topics Concern   Not on file  Social History Narrative   Not on file   Social Determinants of Health   Financial Resource Strain: Not on file  Food Insecurity: Not on file  Transportation Needs: Not on file  Physical Activity: Not on file  Stress: Not on file  Social Connections: Not on file    Sleep: Good  Appetite:  Good  Current Medications: Current Facility-Administered Medications  Medication Dose Route Frequency Provider Last Rate Last Admin   acetaminophen (TYLENOL) tablet 650 mg  650 mg Oral Q6H PRN Jaclyn Shaggy, PA-C       alum & mag hydroxide-simeth (MAALOX/MYLANTA) 200-200-20 MG/5ML suspension 30 mL  30 mL Oral Q4H PRN Melbourne Abts W, PA-C       diphenhydrAMINE (BENADRYL) capsule 25 mg  25 mg Oral Q6H PRN Mason Jim, Linde Wilensky E, MD       Or   diphenhydrAMINE (BENADRYL) injection 25 mg  25 mg Intramuscular Q6H PRN Mason Jim, Ceasia Elwell E, MD   25 mg at 04/17/21 1135   divalproex (DEPAKOTE) DR tablet 500 mg  500 mg Oral BID Comer Locket, MD       Or  LORazepam (ATIVAN) injection 1 mg  1 mg Intramuscular BID Mason Jim, Aariona Momon E, MD   1 mg at 04/18/21 1025   feeding supplement (ENSURE ENLIVE / ENSURE PLUS) liquid 237 mL  237 mL Oral BID BM Laveda Abbe, NP   237 mL at 04/16/21 1521   OLANZapine zydis (ZYPREXA) disintegrating tablet 5 mg  5 mg Oral Q8H PRN Comer Locket, MD       And   LORazepam (ATIVAN) tablet 1 mg  1 mg Oral PRN Comer Locket, MD       And   ziprasidone (GEODON) injection 20 mg  20 mg Intramuscular PRN Comer Locket, MD       magnesium hydroxide (MILK OF MAGNESIA) suspension 30 mL  30 mL Oral Daily PRN Ladona Ridgel, Cody W, PA-C       OLANZapine zydis (ZYPREXA) disintegrating tablet 15 mg  15 mg Oral QHS Mason Jim, Quinnlan Abruzzo E,  MD   15 mg at 04/16/21 2139   Or   OLANZapine (ZYPREXA) injection 5 mg  5 mg Intramuscular QHS Mason Jim, Braylon Lemmons E, MD       OLANZapine zydis (ZYPREXA) disintegrating tablet 5 mg  5 mg Oral Daily Mason Jim, Cleatis Fandrich E, MD       Or   OLANZapine (ZYPREXA) injection 5 mg  5 mg Intramuscular Daily Mason Jim, Kwynn Schlotter E, MD   5 mg at 04/18/21 1025   traZODone (DESYREL) tablet 50 mg  50 mg Oral QHS PRN Jaclyn Shaggy, PA-C        Lab Results: No results found for this or any previous visit (from the past 48 hour(s)).  Blood Alcohol level:  Lab Results  Component Value Date   ETH <10 12/05/2019   ETH <10 10/13/2017    Metabolic Disorder Labs: Lab Results  Component Value Date   HGBA1C 5.4 04/12/2021   MPG 108.28 04/12/2021   No results found for: PROLACTIN Lab Results  Component Value Date   CHOL 201 (H) 04/12/2021   TRIG 58 04/12/2021   HDL 51 04/12/2021   CHOLHDL 3.9 04/12/2021   VLDL 12 04/12/2021   LDLCALC 138 (H) 04/12/2021    Physical Findings: AIMS: Facial and Oral Movements Muscles of Facial Expression: None, normal Lips and Perioral Area: None, normal Jaw: None, normal Tongue: None, normal,Extremity Movements Upper (arms, wrists, hands, fingers): None, normal Lower (legs, knees, ankles, toes): None, normal, Trunk Movements Neck, shoulders, hips: None, normal, Overall Severity Severity of abnormal movements (highest score from questions above): None, normal Incapacitation due to abnormal movements: None, normal Patient's awareness of abnormal movements (rate only patient's report): No Awareness, Dental Status Current problems with teeth and/or dentures?: No Does patient usually wear dentures?: No    Musculoskeletal: Strength & Muscle Tone: within normal limits Gait & Station:  Stayed in bed during interview Patient leans: N/A  Psychiatric Specialty Exam:  Presentation  General Appearance: Appropriate for Environment; Casual  Eye Contact:Minimal  Speech:Clear and  Coherent (only answered in yes/no)  Speech Volume:Normal  Handedness:Right   Mood and Affect  Mood:Irritable, depressed  Affect:constricted, irritable   Thought Process  Thought Processes:concrete  Descriptions of Associations:Intact  Orientation:Full (Time, Place and Person)  Thought Content:Denies AVH, ideas of reference, first rank symptoms and does not make delusional statements on exam today - is not grossly responding to internal/external stimuli on exam but remains guarded and paranoid appearing  History of Schizophrenia/Schizoaffective disorder:No  Duration of Psychotic Symptoms:Less than six months  Hallucinations:Hallucinations: None Ideas of Reference:None  Suicidal Thoughts:Suicidal Thoughts: No Homicidal Thoughts:Homicidal Thoughts: No  Sensorium  Memory:unable to assess today  Judgment:Impaired  Insight:Lacking   Executive Functions  Concentration:Fair  Attention Span:Fair  Recall:uncooperative for testing  Fund of Knowledge:Fair  Language:Fair   Psychomotor Activity  Psychomotor Activity:decreased in bed  Assets  Assets:Communication Skills; Resilience; Social Support; Physical Health   Sleep  Sleep: Sleep: Good Number of Hours of Sleep: 9   Physical Exam: Physical Exam Vitals and nursing note reviewed.  Constitutional:      General: She is not in acute distress.    Appearance: Normal appearance. She is normal weight. She is not ill-appearing or toxic-appearing.  HENT:     Head: Normocephalic and atraumatic.  Pulmonary:     Effort: Pulmonary effort is normal.  Musculoskeletal:        General: Normal range of motion.  Neurological:     General: No focal deficit present.     Mental Status: She is alert.   Review of Systems  Respiratory:  Negative for cough and shortness of breath.   Cardiovascular:  Negative for chest pain.  Gastrointestinal:  Negative for abdominal pain, constipation, diarrhea, nausea and vomiting.   Neurological:  Negative for weakness and headaches.  Psychiatric/Behavioral:  Negative for depression, hallucinations and suicidal ideas. The patient is not nervous/anxious.   Blood pressure 115/75, pulse 68, temperature 97.7 F (36.5 C), temperature source Oral, resp. rate 17, height 5\' 7"  (1.702 m), weight 54.4 kg, SpO2 100 %. Body mass index is 18.79 kg/m.   Treatment Plan Summary: Daily contact with patient to assess and evaluate symptoms and progress in treatment   Miss Mccain is a 20 yr old female who presents under IVC for paranoia and delusional thoughts. PPHx is significant for previous IVC in March 2021 due to aggression.     She continues to refuse all p.o. medications and is instead getting IM.  So far today she has been more subdued and not had any outbursts -hopefully this is because the medications are starting to take effect.  We will continue to encourage p.o. medications.  We will not make any medication changes at this time.  We will continue to monitor.     Acute Psychosis: -Continue Zyprexa Zydis 5 mg AM and 15 mg QHS PO or Zyprexa IM 5 mg if refuses PO -Continue Depakote DR 500 mg BID or Ativan 1 mg IM if refuses PO -Continue Benadryl 25 mg PO or IM q6 PRN -Continue Agitation Protocol:Zyprexa/Ativan/Geodon -Head CT pending when patient can cooperate for neuroimaging     Hypokalemia(resolved):     Nutritional support: -Continue Ensure BID between meals     -Continue PRN's: Tylenol, Maalox, Atarax, Milk of Magnesia, Trazodone     April 2021, MD 04/18/2021, 12:08 PM

## 2021-04-18 NOTE — [facility] Group Notes (Signed)
Due to the acuity on the unit group was not held. Patient was provided therapeutic worksheets and asked to meet with CSW as needed.   Jamir Rone, LCSW, LCAS Clincal Social Worker  [facility] 

## 2021-04-18 NOTE — Tx Team (Signed)
Interdisciplinary Treatment and Diagnostic Plan Update  04/18/2021 Time of Session: 9:30am  Nonna Renninger MRN: [id number]  Principal Diagnosis: Schizophrenia spectrum disorder with psychotic disorder type not yet determined [facility])  Secondary Diagnoses: Principal Problem:   Schizophrenia spectrum disorder with psychotic disorder type not yet determined ([place]) Active Problems:   Aggressive behavior   Cannabis abuse   Current Medications:  Current Facility-Administered Medications  Medication Dose Route Frequency Provider Last Rate Last Admin   acetaminophen (TYLENOL) tablet 650 mg  650 mg Oral Q6H PRN Prescilla Sours, PA-C       alum & mag hydroxide-simeth (MAALOX/MYLANTA) 200-200-20 MG/5ML suspension 30 mL  30 mL Oral Q4H PRN Margorie John W, PA-C       diphenhydrAMINE (BENADRYL) capsule 25 mg  25 mg Oral Q6H PRN Harlow Asa, MD       Or   diphenhydrAMINE (BENADRYL) injection 25 mg  25 mg Intramuscular Q6H PRN Harlow Asa, MD   25 mg at 04/17/21 1135   divalproex (DEPAKOTE) DR tablet 500 mg  500 mg Oral BID Harlow Asa, MD       Or   LORazepam (ATIVAN) injection 1 mg  1 mg Intramuscular BID Nelda Marseille, Amy E, MD   1 mg at 04/18/21 1025   feeding supplement (ENSURE ENLIVE / ENSURE PLUS) liquid 237 mL  237 mL Oral BID BM Ethelene Hal, NP   237 mL at 04/16/21 1521   OLANZapine zydis (ZYPREXA) disintegrating tablet 5 mg  5 mg Oral Q8H PRN Harlow Asa, MD       And   LORazepam (ATIVAN) tablet 1 mg  1 mg Oral PRN Harlow Asa, MD       And   ziprasidone (GEODON) injection 20 mg  20 mg Intramuscular PRN Nelda Marseille, Amy E, MD       magnesium hydroxide (MILK OF MAGNESIA) suspension 30 mL  30 mL Oral Daily PRN Lovena Le, Cody W, PA-C       OLANZapine zydis (ZYPREXA) disintegrating tablet 15 mg  15 mg Oral QHS Nelda Marseille, Amy E, MD   15 mg at 04/16/21 2139   Or   OLANZapine (ZYPREXA) injection 5 mg  5 mg Intramuscular QHS Nelda Marseille, Amy E, MD       OLANZapine zydis  (ZYPREXA) disintegrating tablet 5 mg  5 mg Oral Daily Nelda Marseille, Amy E, MD       Or   OLANZapine (ZYPREXA) injection 5 mg  5 mg Intramuscular Daily Nelda Marseille, Amy E, MD   5 mg at 04/18/21 1025   traZODone (DESYREL) tablet 50 mg  50 mg Oral QHS PRN Prescilla Sours, PA-C       PTA Medications: No medications prior to admission.    Patient Stressors:    Patient Strengths:    Treatment Modalities: Medication Management, Group therapy, Case management,  1 to 1 session with clinician, Psychoeducation, Recreational therapy.   Physician Treatment Plan for Primary Diagnosis: Schizophrenia spectrum disorder with psychotic disorder type not yet determined ([place]) Long Term Goal(s): Improvement in symptoms so as ready for discharge   Short Term Goals: Ability to identify changes in lifestyle to reduce recurrence of condition will improve Ability to verbalize feelings will improve Ability to disclose and discuss suicidal ideas Ability to demonstrate self-control will improve Ability to identify and develop effective coping behaviors will improve Ability to identify triggers associated with substance abuse/mental health issues will improve Compliance with prescribed medications will improve  Medication Management: Evaluate patient's response,  side effects, and tolerance of medication regimen.  Therapeutic Interventions: 1 to 1 sessions, Unit Group sessions and Medication administration.  Evaluation of Outcomes: Not Met  Physician Treatment Plan for Secondary Diagnosis: Principal Problem:   Schizophrenia spectrum disorder with psychotic disorder type not yet determined ([place]) Active Problems:   Aggressive behavior   Cannabis abuse  Long Term Goal(s): Improvement in symptoms so as ready for discharge   Short Term Goals: Ability to identify changes in lifestyle to reduce recurrence of condition will improve Ability to verbalize feelings will improve Ability to disclose and discuss suicidal  ideas Ability to demonstrate self-control will improve Ability to identify and develop effective coping behaviors will improve Ability to identify triggers associated with substance abuse/mental health issues will improve Compliance with prescribed medications will improve     Medication Management: Evaluate patient's response, side effects, and tolerance of medication regimen.  Therapeutic Interventions: 1 to 1 sessions, Unit Group sessions and Medication administration.  Evaluation of Outcomes: Not Met   RN Treatment Plan for Primary Diagnosis: Schizophrenia spectrum disorder with psychotic disorder type not yet determined ([place]) Long Term Goal(s): Knowledge of disease and therapeutic regimen to maintain health will improve  Short Term Goals: Ability to remain free from injury will improve, Ability to participate in decision making will improve, Ability to verbalize feelings will improve, Ability to disclose and discuss suicidal ideas, and Ability to identify and develop effective coping behaviors will improve  Medication Management: RN will administer medications as ordered by provider, will assess and evaluate patient's response and provide education to patient for prescribed medication. RN will report any adverse and/or side effects to prescribing provider.  Therapeutic Interventions: 1 on 1 counseling sessions, Psychoeducation, Medication administration, Evaluate responses to treatment, Monitor vital signs and CBGs as ordered, Perform/monitor CIWA, COWS, AIMS and Fall Risk screenings as ordered, Perform wound care treatments as ordered.  Evaluation of Outcomes: Not Met   LCSW Treatment Plan for Primary Diagnosis: Schizophrenia spectrum disorder with psychotic disorder type not yet determined ([place]) Long Term Goal(s): Safe transition to appropriate next level of care at discharge, Engage patient in therapeutic group addressing interpersonal concerns.  Short Term Goals: Engage patient  in aftercare planning with referrals and resources, Increase social support, Increase emotional regulation, Facilitate acceptance of mental health diagnosis and concerns, Identify triggers associated with mental health/substance abuse issues, and Increase skills for wellness and recovery  Therapeutic Interventions: Assess for all discharge needs, 1 to 1 time with Social worker, Explore available resources and support systems, Assess for adequacy in community support network, Educate family and significant other(s) on suicide prevention, Complete Psychosocial Assessment, Interpersonal group therapy.  Evaluation of Outcomes: Not Met   Progress in Treatment: Attending groups: No. Participating in groups: No. Taking medication as prescribed: Yes. Toleration medication: Yes. Family/Significant other contact made: No, will contact:  declined consents Patient understands diagnosis: No. Discussing patient identified problems/goals with staff: Yes. Medical problems stabilized or resolved: Yes. Denies suicidal/homicidal ideation: Yes. Issues/concerns per patient self-inventory: No.   New problem(s) identified: No, Describe:  None   New Short Term/Long Term Goal(s): medication stabilization, elimination of SI thoughts, development of comprehensive mental wellness plan.   Patient Goals: "To go home"   Discharge Plan or Barriers: Patient recently admitted. CSW will continue to follow and assess for appropriate referrals and possible discharge planning.   Reason for Continuation of Hospitalization: Anxiety Hallucinations Medication stabilization  Estimated Length of Stay: 3 to 5 days   Attendees: Patient:  04/13/2021  Physician:  04/13/2021   Nursing:  04/13/2021   RN Care Manager: 04/13/2021   Social Worker: Darletta Moll, LCSW 04/13/2021   Recreational Therapist:  04/13/2021   Other:  04/13/2021   Other:  04/13/2021   Other: 04/13/2021     Scribe for Treatment Team: Vassie Moselle,  LCSW 04/18/2021 11:32 AM

## 2021-04-18 NOTE — [facility] Group Notes (Signed)
The focus of this group is to help patients establish daily goals to achieve during treatment and discuss how the patient can incorporate goal setting into their daily lives to aide in recovery.  Pt did not attend group 

## 2021-04-18 NOTE — Progress Notes (Signed)
Adult Psychoeducational Group Note  Date:  04/18/2021 Time:  10:50 PM  Group Topic/Focus:  Wrap-Up Group:   The focus of this group is to help patients review their daily goal of treatment and discuss progress on daily workbooks.  Participation Level:  Did Not Attend  Participation Quality:   Did Not Attend  Affect:   Did Not Attend  Cognitive:   Did Not Attend  Insight: None  Engagement in Group:   Did Not Attend  Modes of Intervention:   Did Not Attend  Additional Comments:  Pt did not attend evening wrap up group tonight.  [name] 04/18/2021, 10:50 PM

## 2021-04-19 LAB — RESP PANEL BY RT-PCR (FLU A&B, COVID) ARPGX2
Influenza A by PCR: NEGATIVE
Influenza B by PCR: NEGATIVE
SARS Coronavirus 2 by RT PCR: NEGATIVE

## 2021-04-19 NOTE — Progress Notes (Signed)
Adult Psychoeducational Group Note  Date:  04/19/2021 Time:  9:40 PM  Group Topic/Focus:  Wrap-Up Group:   The focus of this group is to help patients review their daily goal of treatment and discuss progress on daily workbooks.  Participation Level:  Active  Participation Quality:  Appropriate  Affect:  Anxious  Cognitive:  Disorganized  Insight: Limited  Engagement in Group:  Limited  Modes of Intervention:  Discussion  Additional Comments:  Pt stated her goal for today was to focus on her treatment plan and to talk with her doctor about her discharge plan.  Pt stated she accomplished her goals today. Pt stated the plan is for her to be discharged later this week. Pt stated she did not talk with her doctor or her social worker about her care today. Pt rated her overall day a 10. Pt stated she was able to contact her aunt today which improved her overall day. Pt stated she felt better about herself tonight. Pt stated she was able to attend all groups held today. Pt stated she was not able to attend meals today because she is on unit restrictions, staff brought her back all meals today. Pt stated she took all medications provided today. Pt stated her appetite was pretty good today. Pt rated her sleep last night was pretty good. Pt stated the goal tonight was to get some rest. Pt stated she had no physical pain today. Pt deny visual hallucinations and auditory issues tonight. Pt denies thoughts of harming herself or others. Pt stated she would alert staff if anything changed.  Monica Richardson 04/19/2021, 9:40 PM

## 2021-04-19 NOTE — Progress Notes (Signed)
   04/19/21 0621  Vital Signs  Temp (!) 97.3 F (36.3 C)  Temp Source Oral  Pulse Rate 93  Pulse Rate Source Monitor  BP (!) 121/91  BP Location Left Arm  BP Method Automatic  Patient Position (if appropriate) Sitting  Oxygen Therapy  SpO2 100 %

## 2021-04-19 NOTE — Progress Notes (Signed)
Pt did not attend orientation/goals group. 

## 2021-04-19 NOTE — Progress Notes (Signed)
Pt is irritable and depressed.  Pt becomes easily agitated and is suspicious and paranoid at times.  Pt denied SI/HI.  Pt did not answer if she was hearing voices or seeing things that may not be there.  Pt remains safe on the unit with q 15 min room checks in place.  RN will continue to monitor and provide support as needed.  04/19/21 1030  Psych Admission Type (Psych Patients Only)  Admission Status Involuntary  Psychosocial Assessment  Patient Complaints Irritability;Tension;Suspiciousness;Worrying;Nervousness  Eye Contact Avoids  Facial Expression Anxious  Affect Irritable  Speech Argumentative;Elective mutism  Interaction Avoidant;Guarded;Isolative;No initiation  Motor Activity Slow  Appearance/Hygiene In scrubs  Behavior Characteristics Irritable;Impulsive  Mood Irritable;Helpless  Thought Process  Coherency Concrete thinking;Tangential  Content Blaming others  Delusions None reported or observed  Perception Hallucinations  Hallucination None reported or observed  Judgment Poor  Confusion None  Danger to Self  Current suicidal ideation? Denies  Danger to Others  Danger to Others None reported or observed

## 2021-04-19 NOTE — Progress Notes (Addendum)
[facility] MD Progress Note  04/19/2021 10:45 AM Monica Richardson  MRN:  [id number] Subjective:   Miss Holtrop is a 20 yr old female who presents under IVC for paranoia and delusional thoughts. PPHx is significant for previous IVC in March 2021 due to aggression.   Reports that she is doing fine today.  She reports that she slept well last night.  She reports that her appetite is improving and she was able to eat some.  She reports no SI, HI, or AVH.  She reports no paranoia or other first rank symptoms.  Praised her for taking her oral medications last night.  Encouraged her to continue to take them today.  She continues to deny needing any medication or needing food.  When asked what she can do to get out of here quickest discussed that continuing to take her oral medications is that way.  She has no other concerns at this time.   Principal Problem: Schizophrenia spectrum disorder with psychotic disorder type not yet determined ([facility]) Diagnosis: Principal Problem:   Schizophrenia spectrum disorder with psychotic disorder type not yet determined [facility]) Active Problems:   Aggressive behavior   Cannabis abuse  Total Time spent with patient: 30 minutes  Past Psychiatric History: IVC in March 2021 due to aggression  Past Medical History: History reviewed. No pertinent past medical history. History reviewed. No pertinent surgical history. Family History: History reviewed. No pertinent family history. Family Psychiatric  History: None Social History:  Social History   Substance and Sexual Activity  Alcohol Use No     Social History   Substance and Sexual Activity  Drug Use Yes   Types: Marijuana    Social History   Socioeconomic History   Marital status: Single    Spouse name: Not on file   Number of children: Not on file   Years of education: Not on file   Highest education level: Not on file  Occupational History   Not on file  Tobacco Use   Smoking status: Never   Smokeless tobacco:  Never  Substance and Sexual Activity   Alcohol use: No   Drug use: Yes    Types: Marijuana   Sexual activity: Not on file  Other Topics Concern   Not on file  Social History Narrative   Not on file   Social Determinants of Health   Financial Resource Strain: Not on file  Food Insecurity: Not on file  Transportation Needs: Not on file  Physical Activity: Not on file  Stress: Not on file  Social Connections: Not on file   Additional Social History:                         Sleep: Good  Appetite:  Fair  Current Medications: Current Facility-Administered Medications  Medication Dose Route Frequency Provider Last Rate Last Admin   acetaminophen (TYLENOL) tablet 650 mg  650 mg Oral Q6H PRN Jaclyn Shaggy, PA-C       alum & mag hydroxide-simeth (MAALOX/MYLANTA) 200-200-20 MG/5ML suspension 30 mL  30 mL Oral Q4H PRN Melbourne Abts W, PA-C       diphenhydrAMINE (BENADRYL) capsule 25 mg  25 mg Oral Q6H PRN Mason Jim, Amy E, MD       Or   diphenhydrAMINE (BENADRYL) injection 25 mg  25 mg Intramuscular Q6H PRN Mason Jim, Amy E, MD   25 mg at 04/17/21 1135   divalproex (DEPAKOTE) DR tablet 500 mg  500 mg Oral BID  Comer Locket, MD   500 mg at 04/19/21 8280   Or   LORazepam (ATIVAN) injection 1 mg  1 mg Intramuscular BID Bartholomew Crews E, MD   1 mg at 04/18/21 1828   feeding supplement (ENSURE ENLIVE / ENSURE PLUS) liquid 237 mL  237 mL Oral BID BM Laveda Abbe, NP   237 mL at 04/16/21 1521   OLANZapine zydis (ZYPREXA) disintegrating tablet 5 mg  5 mg Oral Q8H PRN Comer Locket, MD       And   LORazepam (ATIVAN) tablet 1 mg  1 mg Oral PRN Comer Locket, MD       And   ziprasidone (GEODON) injection 20 mg  20 mg Intramuscular PRN Mason Jim, Amy E, MD       magnesium hydroxide (MILK OF MAGNESIA) suspension 30 mL  30 mL Oral Daily PRN Ladona Ridgel, Cody W, PA-C       OLANZapine zydis (ZYPREXA) disintegrating tablet 15 mg  15 mg Oral QHS Mason Jim, Amy E, MD   15 mg at  04/18/21 2143   Or   OLANZapine (ZYPREXA) injection 5 mg  5 mg Intramuscular QHS Mason Jim, Amy E, MD       OLANZapine zydis (ZYPREXA) disintegrating tablet 5 mg  5 mg Oral Daily Mason Jim, Amy E, MD   5 mg at 04/19/21 0932   Or   OLANZapine (ZYPREXA) injection 5 mg  5 mg Intramuscular Daily Mason Jim, Amy E, MD   5 mg at 04/18/21 1025   traZODone (DESYREL) tablet 50 mg  50 mg Oral QHS PRN Jaclyn Shaggy, PA-C        Lab Results: No results found for this or any previous visit (from the past 48 hour(s)).  Blood Alcohol level:  Lab Results  Component Value Date   ETH <10 12/05/2019   ETH <10 10/13/2017    Metabolic Disorder Labs: Lab Results  Component Value Date   HGBA1C 5.4 04/12/2021   MPG 108.28 04/12/2021   No results found for: PROLACTIN Lab Results  Component Value Date   CHOL 201 (H) 04/12/2021   TRIG 58 04/12/2021   HDL 51 04/12/2021   CHOLHDL 3.9 04/12/2021   VLDL 12 04/12/2021   LDLCALC 138 (H) 04/12/2021    Physical Findings: AIMS: Facial and Oral Movements Muscles of Facial Expression: None, normal Lips and Perioral Area: None, normal Jaw: None, normal Tongue: None, normal,Extremity Movements Upper (arms, wrists, hands, fingers): None, normal Lower (legs, knees, ankles, toes): None, normal, Trunk Movements Neck, shoulders, hips: None, normal, Overall Severity Severity of abnormal movements (highest score from questions above): None, normal Incapacitation due to abnormal movements: None, normal Patient's awareness of abnormal movements (rate only patient's report): No Awareness, Dental Status Current problems with teeth and/or dentures?: No Does patient usually wear dentures?: No  CIWA:    COWS:     Musculoskeletal: Strength & Muscle Tone: within normal limits Gait & Station: normal Patient leans: N/A  Psychiatric Specialty Exam:  Presentation  General Appearance: Appropriate for Environment; Casual; Fairly Groomed  Eye  Contact:Fair  Speech:Clear and Coherent (minimal responses to questions)  Speech Volume:Decreased  Handedness:Right   Mood and Affect  Mood:Depressed; Hopeless  Affect:Flat; Depressed   Thought Process  Thought Processes:Coherent  Descriptions of Associations:Intact  Orientation:Full (Time, Place and Person)  Thought Content:Logical; WDL  History of Schizophrenia/Schizoaffective disorder:No  Duration of Psychotic Symptoms:Less than six months  Hallucinations:Hallucinations: None  Ideas of Reference:None  Suicidal Thoughts:Suicidal Thoughts: No  Homicidal Thoughts:Homicidal  Thoughts: No   Sensorium  Memory:Immediate Fair; Recent Fair  Judgment:Impaired (improving)  Insight:Lacking (improving)   Executive Functions  Concentration:Fair  Attention Span:Fair  Recall:Fair  [employer] of Knowledge:Fair  Language:Fair   Psychomotor Activity  Psychomotor Activity:Psychomotor Activity: Normal   Assets  Assets:Communication Skills; Resilience; Social Support; Physical Health   Sleep  Sleep:Sleep: Good Number of Hours of Sleep: 6.75    Physical Exam: Physical Exam Vitals and nursing note reviewed.  Constitutional:      General: She is not in acute distress.    Appearance: Normal appearance. She is normal weight. She is not ill-appearing or toxic-appearing.  HENT:     Head: Normocephalic and atraumatic.  Pulmonary:     Effort: Pulmonary effort is normal.  Musculoskeletal:        General: Normal range of motion.  Neurological:     General: No focal deficit present.     Mental Status: She is alert.   Review of Systems  Respiratory:  Negative for cough and shortness of breath.   Cardiovascular:  Negative for chest pain.  Gastrointestinal:  Negative for abdominal pain, constipation, diarrhea, nausea and vomiting.  Neurological:  Negative for weakness and headaches.  Psychiatric/Behavioral:  Negative for depression, hallucinations and suicidal  ideas. The patient is not nervous/anxious.   Blood pressure 118/74, pulse (!) 133, temperature (!) 97.3 F (36.3 C), temperature source Oral, resp. rate 17, height 5\' 7"  (1.702 m), weight 54.4 kg, SpO2 100 %. Body mass index is 18.79 kg/m.   Treatment Plan Summary: Daily contact with patient to assess and evaluate symptoms and progress in treatment   Miss Deupree is a 20 yr old female who presents under IVC for paranoia and delusional thoughts. PPHx is significant for previous IVC in March 2021 due to aggression.     She did take her oral medications last night.  With further encouragement she did take her oral medications this morning.  Hopeful that this means she will begin to improve.  When talking with me this morning she was subdued and depressed, however, still denying the need to be here or take medications.  Now that she is taking her oral medications we will not make any changes at this time.  Potentially within the next few days she would be calm enough to get that head CT.  We will continue to monitor.     Acute Psychosis: -Continue Zyprexa Zydis 5 mg AM and 15 mg QHS PO or Zyprexa IM 5 mg if refuses PO -Continue Depakote DR 500 mg BID or Ativan 1 mg IM if refuses PO -Continue Benadryl 25 mg PO or IM q6 PRN -Continue Agitation Protocol:Zyprexa/Ativan/Geodon -Head CT pending when patient can cooperate for neuroimaging     Hypokalemia(resolved):     Nutritional support: -Continue Ensure BID between meals     -Continue PRN's: Tylenol, Maalox, Atarax, Milk of Magnesia, Trazodone     April 2021, MD 04/19/2021, 10:45 AM

## 2021-04-20 NOTE — Progress Notes (Signed)
Pt did not attend orientation/goals group. 

## 2021-04-20 NOTE — Progress Notes (Signed)
[facility]  04/20/2021 10:47 AM Monica Richardson  MRN:  [id number] Subjective:   Monica Richardson is a 20 yr old female who presents under IVC for paranoia and delusional thoughts. PPHx is significant for previous IVC in March 2021 due to aggression.   When interviewing the patient today she was in her bed again with the covers pulled over her head.  She was minimally interactive and very guarded.  She would only answer with short answers mostly on the yes/no.   She reports that she is doing okay today.  She reports that she is sleeping fine.  She reports that her appetite is doing fine.  She reports no SI, HI, or AVH.  She reports no first rank symptoms.  Praised her for taking her p.o. medications.  Encouraged her to continue this.  She asked what would allow her to leave the hospital the fastest and I discussed that continuing to take her medications would be the fastest way.  She had no other concerns at present.   Principal Problem: Schizophrenia spectrum disorder with psychotic disorder type not yet determined (HCC) Diagnosis: Principal Problem:   Schizophrenia spectrum disorder with psychotic disorder type not yet determined [facility](HCC) Active Problems:   Aggressive behavior   Cannabis abuse  Total Time spent with patient: 30 minutes  Past Psychiatric History: IVC in March 2021 due to aggression  Past Medical History: History reviewed. No pertinent past medical history. History reviewed. No pertinent surgical history. Family History: History reviewed. No pertinent family history. Family Psychiatric  History: None Social History:  Social History   Substance and Sexual Activity  Alcohol Use No     Social History   Substance and Sexual Activity  Drug Use Yes   Types: Marijuana    Social History   Socioeconomic History   Marital status: Single    Spouse name: Not on file   Number of children: Not on file   Years of education: Not on file   Highest education level: Not on  file  Occupational History   Not on file  Tobacco Use   Smoking status: Never   Smokeless tobacco: Never  Substance and Sexual Activity   Alcohol use: No   Drug use: Yes    Types: Marijuana   Sexual activity: Not on file  Other Topics Concern   Not on file  Social History Narrative   Not on file   Social Determinants of Health   Financial Resource Strain: Not on file  Food Insecurity: Not on file  Transportation Needs: Not on file  Physical Activity: Not on file  Stress: Not on file  Social Connections: Not on file   Additional Social History:                         Sleep: Good  Appetite:  Fair  Current Medications: Current Facility-Administered Medications  Medication Dose Route Frequency Provider Last Rate Last Admin   acetaminophen (TYLENOL) tablet 650 mg  650 mg Oral Q6H PRN Jaclyn Shaggyaylor, Cody W, PA-C       alum & mag hydroxide-simeth (MAALOX/MYLANTA) 200-200-20 MG/5ML suspension 30 mL  30 mL Oral Q4H PRN Melbourne Abtsaylor, Cody W, PA-C       diphenhydrAMINE (BENADRYL) capsule 25 mg  25 mg Oral Q6H PRN Mason JimSingleton, Amy E, MD       Or   diphenhydrAMINE (BENADRYL) injection 25 mg  25 mg Intramuscular Q6H PRN Comer LocketSingleton, Amy E, MD   25  mg at 04/17/21 1135   divalproex (DEPAKOTE) DR tablet 500 mg  500 mg Oral BID Comer Locket, MD   500 mg at 04/19/21 1743   Or   LORazepam (ATIVAN) injection 1 mg  1 mg Intramuscular BID Comer Locket, MD   1 mg at 04/20/21 0919   feeding supplement (ENSURE ENLIVE / ENSURE PLUS) liquid 237 mL  237 mL Oral BID BM Laveda Abbe, NP   237 mL at 04/16/21 1521   OLANZapine zydis (ZYPREXA) disintegrating tablet 5 mg  5 mg Oral Q8H PRN Comer Locket, MD       And   LORazepam (ATIVAN) tablet 1 mg  1 mg Oral PRN Comer Locket, MD       And   ziprasidone (GEODON) injection 20 mg  20 mg Intramuscular PRN Comer Locket, MD       magnesium hydroxide (MILK OF MAGNESIA) suspension 30 mL  30 mL Oral Daily PRN Ladona Ridgel, Cody W, PA-C        OLANZapine zydis (ZYPREXA) disintegrating tablet 15 mg  15 mg Oral QHS Mason Jim, Amy E, MD   15 mg at 04/19/21 2046   Or   OLANZapine (ZYPREXA) injection 5 mg  5 mg Intramuscular QHS Mason Jim, Amy E, MD       OLANZapine zydis (ZYPREXA) disintegrating tablet 5 mg  5 mg Oral Daily Mason Jim, Amy E, MD   5 mg at 04/19/21 0932   Or   OLANZapine (ZYPREXA) injection 5 mg  5 mg Intramuscular Daily Mason Jim, Amy E, MD   5 mg at 04/20/21 6295   traZODone (DESYREL) tablet 50 mg  50 mg Oral QHS PRN Jaclyn Shaggy, PA-C        Lab Results:  Results for orders placed or performed during the hospital encounter of 04/12/21 (from the past 48 hour(s))  Resp Panel by RT-PCR (Flu A&B, Covid) Nasopharyngeal Swab     Status: None   Collection Time: 04/19/21  2:52 PM   Specimen: Nasopharyngeal Swab; Nasopharyngeal(NP) swabs in vial transport medium  Result Value Ref Range   SARS Coronavirus 2 by RT PCR NEGATIVE NEGATIVE    Comment: (NOTE) SARS-CoV-2 target nucleic acids are NOT DETECTED.  The SARS-CoV-2 RNA is generally detectable in upper respiratory specimens during the acute phase of infection. The lowest concentration of SARS-CoV-2 viral copies this assay can detect is 138 copies/mL. A negative result does not preclude SARS-Cov-2 infection and should not be used as the sole basis for treatment or other patient management decisions. A negative result may occur with  improper specimen collection/handling, submission of specimen other than nasopharyngeal swab, presence of viral mutation(s) within the areas targeted by this assay, and inadequate number of viral copies(<138 copies/mL). A negative result must be combined with clinical observations, patient history, and epidemiological information. The expected result is Negative.  Fact Sheet for Patients:  BloggerCourse.com  Fact Sheet for Healthcare Providers:  SeriousBroker.it  This test is no t yet  approved or cleared by the [place] FDA and  has been authorized for detection and/or diagnosis of SARS-CoV-2 by FDA under an Emergency Use Authorization (EUA). This EUA will remain  in effect (meaning this test can be used) for the duration of the COVID-19 declaration under Section 564(b)(1) of the Act, 21 U.S.C.section 360bbb-3(b)(1), unless the authorization is terminated  or revoked sooner.       Influenza A by PCR NEGATIVE NEGATIVE   Influenza B by PCR NEGATIVE NEGATIVE  Comment: (NOTE) The Xpert Xpress SARS-CoV-2/FLU/RSV plus assay is intended as an aid in the diagnosis of influenza from Nasopharyngeal swab specimens and should not be used as a sole basis for treatment. Nasal washings and aspirates are unacceptable for Xpert Xpress SARS-CoV-2/FLU/RSV testing.  Fact Sheet for Patients: BloggerCourse.com  Fact Sheet for Healthcare Providers: SeriousBroker.it  This test is not yet approved or cleared by the [place] FDA and has been authorized for detection and/or diagnosis of SARS-CoV-2 by FDA under an Emergency Use Authorization (EUA). This EUA will remain in effect (meaning this test can be used) for the duration of the COVID-19 declaration under Section 564(b)(1) of the Act, 21 U.S.C. section 360bbb-3(b)(1), unless the authorization is terminated or revoked.  Performed at [facility], [address] [place], [place] [address]     Blood Alcohol level:  Lab Results  Component Value Date   ETH <10 12/05/2019   ETH <10 10/13/2017    Metabolic Disorder Labs: Lab Results  Component Value Date   HGBA1C 5.4 04/12/2021   MPG 108.28 04/12/2021   No results found for: PROLACTIN Lab Results  Component Value Date   CHOL 201 (H) 04/12/2021   TRIG 58 04/12/2021   HDL 51 04/12/2021   CHOLHDL 3.9 04/12/2021   VLDL 12 04/12/2021   LDLCALC 138 (H) 04/12/2021    Physical  Findings:  Musculoskeletal: Strength & Muscle Tone: within normal limits Gait & Station: normal Patient leans: N/A  Psychiatric Specialty Exam:  Presentation  General Appearance: Appropriate for Environment; Casual; Fairly Groomed  Eye Contact:Fair  Speech:Clear and Coherent (minimal responses to questions)  Speech Volume:Decreased  Handedness:Right   Mood and Affect  Mood:Depressed; Hopeless  Affect:Flat   Thought Process  Thought Processes:Coherent  Descriptions of Associations:Intact  Orientation:Full (Time, Place and Person)  Thought Content:Logical; WDL  History of Schizophrenia/Schizoaffective disorder:No  Duration of Psychotic Symptoms:Less than six months  Hallucinations:Hallucinations: None  Ideas of Reference:None  Suicidal Thoughts:Suicidal Thoughts: No  Homicidal Thoughts:Homicidal Thoughts: No   Sensorium  Memory:Immediate Fair; Recent Fair  Judgment:Impaired  Insight:Lacking   Executive Functions  Concentration:Fair  Attention Span:Fair  Recall:Fair  Fund of Knowledge:Fair  Language:Fair   Psychomotor Activity  Psychomotor Activity:Psychomotor Activity: Normal   Assets  Assets:Communication Skills; Resilience; Social Support; Physical Health   Sleep  Sleep:Sleep: Fair Number of Hours of Sleep: 4.5    Physical Exam: Physical Exam Vitals and nursing note reviewed.  Constitutional:      General: She is not in acute distress.    Appearance: Normal appearance. She is normal weight. She is not ill-appearing or toxic-appearing.  HENT:     Head: Normocephalic and atraumatic.  Pulmonary:     Effort: Pulmonary effort is normal.  Musculoskeletal:        General: Normal range of motion.  Neurological:     General: No focal deficit present.     Mental Status: She is alert.   Review of Systems  Respiratory:  Negative for cough and shortness of breath.   Cardiovascular:  Negative for chest pain.  Gastrointestinal:   Negative for abdominal pain, constipation, diarrhea, nausea and vomiting.  Neurological:  Negative for weakness and headaches.  Psychiatric/Behavioral:  Negative for depression, hallucinations and suicidal ideas. The patient is not nervous/anxious.   Blood pressure 126/85, pulse 99, temperature (!) 97.5 F (36.4 C), temperature source Oral, resp. rate 17, height 5\' 7"  (1.702 m), weight 54.4 kg, SpO2 100 %. Body mass index is 18.79 kg/m.   Treatment  Plan Summary: Daily contact with patient to assess and evaluate symptoms and progress in treatment   Monica Kahan is a 20 yr old female who presents under IVC for paranoia and delusional thoughts. PPHx is significant for previous IVC in March 2021 due to aggression.     She continues to be minimally interactive/guarded on interview this morning.  However, per nursing notes she was still agitated/delusional yesterday afternoon.  She is now consistently taking oral medications so we will see how she responds to the Depakote as this builds up in her system.  If she is still having these behaviors tomorrow may make further medication changes, however, at this time we will not make any.  We will continue to monitor.     Acute Psychosis: -Continue Zyprexa Zydis 5 mg AM and 15 mg QHS PO or Zyprexa IM 5 mg if refuses PO -Continue Depakote DR 500 mg BID or Ativan 1 mg IM if refuses PO -Continue Benadryl 25 mg PO or IM q6 PRN -Continue Agitation Protocol:Zyprexa/Ativan/Geodon -Head CT pending when patient can cooperate for neuroimaging     Hypokalemia(resolved):     Nutritional support: -Continue Ensure BID between meals     -Continue PRN's: Tylenol, Maalox, Atarax, Milk of Magnesia, Trazodone     Lauro Franklin, MD 04/20/2021, 10:47 AM

## 2021-04-20 NOTE — Progress Notes (Signed)
D- Patient alert and oriented. Affect/mood agitated then calm after I/M medications were given.  Denies SI, HI, AVH, and pain.   A- Scheduled medications administered to patient, per MD orders. Support and encouragement provided.  Routine safety checks conducted every 15 minutes.  Patient informed to notify staff with problems or concerns.  R- No adverse drug reactions noted. Patient contracts for safety at this time. Patient compliant with medications and treatment plan. Patient receptive, calm, and cooperative at this time, but was agitated earlier in the shift. Patient interacts well with others on the unit.  Patient remains safe at this time.    04/20/21 1119  Charting Type  Charting Type Shift assessment  Assessment of needs addressed Yes  Orders Chart Check (once per shift) Completed  Safety Check Verification  Has the RN verified the 15 minute safety check completion? Yes  Neurological  Neuro (WDL) WDL  HEENT  HEENT (WDL) WDL  Respiratory  Respiratory (WDL) WDL  Cardiac  Cardiac (WDL) WDL  Vascular  Vascular (WDL) WDL  Integumentary  Integumentary (WDL) WDL  Braden Scale (Ages 8 and up)  Sensory Perceptions 4  Moisture 4  Activity 4  Mobility 4  Nutrition 3  Friction and Shear 3  Braden Scale Score 22  Musculoskeletal  Musculoskeletal (WDL) WDL  Assistive Device None  Gastrointestinal  Gastrointestinal (WDL) WDL  GU Assessment  Genitourinary (WDL) WDL  Neurological  Level of Consciousness Alert

## 2021-04-20 NOTE — Progress Notes (Addendum)
   04/19/21 2100  Psych Admission Type (Psych Patients Only)  Admission Status Involuntary  Psychosocial Assessment  Patient Complaints Irritability  Eye Contact Avertive  Facial Expression Anxious  Affect Irritable  Speech Argumentative;Elective mutism  Interaction Avoidant;Guarded  Motor Activity Slow  Appearance/Hygiene In scrubs  Behavior Characteristics Irritable;Impulsive  Mood Irritable  Thought Process  Coherency Concrete thinking;Tangential  Content Blaming others  Delusions None reported or observed  Perception Hallucinations  Hallucination None reported or observed  Judgment Poor  Confusion None  Danger to Self  Current suicidal ideation? Denies (pt refused to answer)  Danger to Others  Danger to Others None reported or observed   Pt minimal. Seen going to dayroom. Pt still preoccupied with leaving. "I'm gonna sue all of you for giving me medicine against my will." Pt initially refused her nighttime meds. "I'm fine. I don't need meds. Why? Why I gotta take it?" Pt given option of taking PO or IM. Pt irritable and reluctantly takes meds PO. Tries to walk off but was stopped and made to stand near nurse's station until Zydis dissolved. Check of mouth revealed all medicine dissolved. Pt refused psychological assessment. When asked if she had thoughts of hurting herself, pt responded, "Have you had thoughts of hurting yourself?" Pt asked to see IVC paperwork. Told pt she will have to ask provider.

## 2021-04-20 NOTE — [facility] Group Notes (Signed)
Due to COVID precautions on the unit, pts were given packet to complete individually.      Mayfield Schoene MSW, LCSW Clincal Social Worker [facility]  

## 2021-04-20 NOTE — Progress Notes (Signed)
Psychoeducational Group Note  Date:  04/20/2021 Time:  2000  Group Topic/Focus:  Wrap up group  Participation Level: Did Not Attend  Participation Quality:  Not Applicable  Affect:  Not Applicable  Cognitive:  Not Applicable  Insight:  Not Applicable  Engagement in Group: Not Applicable  Additional Comments:  Did not attend.  Marcille Buffy 04/20/2021, 10:25 PM

## 2021-04-20 NOTE — Progress Notes (Signed)
Notified by nursing staff that patient is complaining of green, foul-smelling vaginal discharge.  Per nursing staff, patient denies dysuria, but refuses to answer any additional questions at this time.  Orders placed for UA to evaluate for potential UTI, as well as for GC/Chlamydia urine to evaluate for potential STI.

## 2021-04-20 NOTE — Plan of Care (Signed)
  Problem: Education: Goal: Emotional status will improve Outcome: Progressing Goal: Mental status will improve Outcome: Progressing   

## 2021-04-21 MED ORDER — LORAZEPAM 2 MG/ML IJ SOLN: 1.0000 mg | Freq: Two times a day (BID) | INTRAMUSCULAR | Status: DC

## 2021-04-21 MED ORDER — LORAZEPAM 2 MG/ML IJ SOLN
1.0000 mg | Freq: Two times a day (BID) | INTRAMUSCULAR | Status: DC
  Filled 2021-04-21: qty 1

## 2021-04-21 MED ORDER — RISPERIDONE 2 MG PO TBDP
2.0000 mg | ORAL_TABLET | Freq: Every day | ORAL | Status: DC
  Administered 2021-04-21 – 2021-04-24 (×4): 2 mg via ORAL
  Filled 2021-04-21 (×6): qty 1

## 2021-04-21 MED ORDER — VALPROIC ACID 250 MG/5ML PO SOLN
500.0000 mg | Freq: Two times a day (BID) | ORAL | Status: DC
  Administered 2021-04-21 – 2021-04-24 (×5): 500 mg via ORAL
  Filled 2021-04-21 (×10): qty 10

## 2021-04-21 MED ORDER — VALPROIC ACID 250 MG/5ML PO SOLN: 500.0000 mg | Freq: Two times a day (BID) | ORAL | Status: DC

## 2021-04-21 MED ORDER — RISPERIDONE 3 MG PO TBDP
3.0000 mg | ORAL_TABLET | Freq: Every day | ORAL | Status: DC
  Administered 2021-04-21 – 2021-04-23 (×3): 3 mg via ORAL
  Filled 2021-04-21 (×5): qty 1

## 2021-04-21 NOTE — Progress Notes (Signed)
Adult Psychoeducational Group Note  Date:  04/21/2021 Time:  9:14 PM  Group Topic/Focus:  Wrap-Up Group:  The focus of this group is to help patients review their daily goal of treatment and discuss progress on daily workbooks.   Participation Level: Did Not Attend   Participation Quality: Not Applicable    Affect: Not Applicable    Cognitive: Not Applicable    Insight: None   Engagement in Group: Not Applicable    Modes of Intervention: Not Applicable    Additional Comments:  Pt did not attend evening wrap up group tonight   Nicoletta Dress 04/21/2021, 9:14 PM

## 2021-04-21 NOTE — Tx Team (Signed)
Interdisciplinary Treatment and Diagnostic Plan Update  04/21/2021 Time of Session: 9:30am  Monica Richardson MRN: [id number]  Principal Diagnosis: Schizophrenia spectrum disorder with psychotic disorder type not yet determined [facility])  Secondary Diagnoses: Principal Problem:   Schizophrenia spectrum disorder with psychotic disorder type not yet determined (HCC) Active Problems:   Aggressive behavior   Cannabis abuse   Current Medications:  Current Facility-Administered Medications  Medication Dose Route Frequency Provider Last Rate Last Admin   acetaminophen (TYLENOL) tablet 650 mg  650 mg Oral Q6H PRN Jaclyn Shaggy, PA-C       alum & mag hydroxide-simeth (MAALOX/MYLANTA) 200-200-20 MG/5ML suspension 30 mL  30 mL Oral Q4H PRN Melbourne Abts W, PA-C       diphenhydrAMINE (BENADRYL) capsule 25 mg  25 mg Oral Q6H PRN Comer Locket, MD       Or   diphenhydrAMINE (BENADRYL) injection 25 mg  25 mg Intramuscular Q6H PRN Mason Jim, Amy E, MD   25 mg at 04/17/21 1135   feeding supplement (ENSURE ENLIVE / ENSURE PLUS) liquid 237 mL  237 mL Oral BID BM Laveda Abbe, NP   237 mL at 04/16/21 1521   valproic acid (DEPAKENE) 250 MG/5ML solution 500 mg  500 mg Oral BID Lauro Franklin, MD       Or   LORazepam (ATIVAN) injection 1 mg  1 mg Intravenous BID Pashayan, Mardelle Matte, MD       OLANZapine zydis (ZYPREXA) disintegrating tablet 5 mg  5 mg Oral Q8H PRN Comer Locket, MD       And   LORazepam (ATIVAN) tablet 1 mg  1 mg Oral PRN Comer Locket, MD       And   ziprasidone (GEODON) injection 20 mg  20 mg Intramuscular PRN Mason Jim, Amy E, MD       magnesium hydroxide (MILK OF MAGNESIA) suspension 30 mL  30 mL Oral Daily PRN Ladona Ridgel, Cody W, PA-C       risperiDONE (RISPERDAL M-TABS) disintegrating tablet 2 mg  2 mg Oral Daily Lauro Franklin, MD   2 mg at 04/21/21 0944   risperiDONE (RISPERDAL M-TABS) disintegrating tablet 3 mg  3 mg Oral QHS Lauro Franklin, MD        traZODone (DESYREL) tablet 50 mg  50 mg Oral QHS PRN Jaclyn Shaggy, PA-C       PTA Medications: No medications prior to admission.    Patient Stressors:    Patient Strengths:    Treatment Modalities: Medication Management, Group therapy, Case management,  1 to 1 session with clinician, Psychoeducation, Recreational therapy.   Physician Treatment Plan for Primary Diagnosis: Schizophrenia spectrum disorder with psychotic disorder type not yet determined (HCC) Long Term Goal(s): Improvement in symptoms so as ready for discharge   Short Term Goals: Ability to identify changes in lifestyle to reduce recurrence of condition will improve Ability to verbalize feelings will improve Ability to disclose and discuss suicidal ideas Ability to demonstrate self-control will improve Ability to identify and develop effective coping behaviors will improve Ability to identify triggers associated with substance abuse/mental health issues will improve Compliance with prescribed medications will improve  Medication Management: Evaluate patient's response, side effects, and tolerance of medication regimen.  Therapeutic Interventions: 1 to 1 sessions, Unit Group sessions and Medication administration.  Evaluation of Outcomes: Progressing  Physician Treatment Plan for Secondary Diagnosis: Principal Problem:   Schizophrenia spectrum disorder with psychotic disorder type not yet determined (HCC) Active Problems:  Aggressive behavior   Cannabis abuse  Long Term Goal(s): Improvement in symptoms so as ready for discharge   Short Term Goals: Ability to identify changes in lifestyle to reduce recurrence of condition will improve Ability to verbalize feelings will improve Ability to disclose and discuss suicidal ideas Ability to demonstrate self-control will improve Ability to identify and develop effective coping behaviors will improve Ability to identify triggers associated with substance abuse/mental  health issues will improve Compliance with prescribed medications will improve     Medication Management: Evaluate patient's response, side effects, and tolerance of medication regimen.  Therapeutic Interventions: 1 to 1 sessions, Unit Group sessions and Medication administration.  Evaluation of Outcomes: Progressing   RN Treatment Plan for Primary Diagnosis: Schizophrenia spectrum disorder with psychotic disorder type not yet determined (HCC) Long Term Goal(s): Knowledge of disease and therapeutic regimen to maintain health will improve  Short Term Goals: Ability to remain free from injury will improve, Ability to participate in decision making will improve, Ability to verbalize feelings will improve, Ability to disclose and discuss suicidal ideas, and Ability to identify and develop effective coping behaviors will improve  Medication Management: RN will administer medications as ordered by provider, will assess and evaluate patient's response and provide education to patient for prescribed medication. RN will report any adverse and/or side effects to prescribing provider.  Therapeutic Interventions: 1 on 1 counseling sessions, Psychoeducation, Medication administration, Evaluate responses to treatment, Monitor vital signs and CBGs as ordered, Perform/monitor CIWA, COWS, AIMS and Fall Risk screenings as ordered, Perform wound care treatments as ordered.  Evaluation of Outcomes: Progressing   LCSW Treatment Plan for Primary Diagnosis: Schizophrenia spectrum disorder with psychotic disorder type not yet determined (HCC) Long Term Goal(s): Safe transition to appropriate next level of care at discharge, Engage patient in therapeutic group addressing interpersonal concerns.  Short Term Goals: Engage patient in aftercare planning with referrals and resources, Increase social support, Increase emotional regulation, Facilitate acceptance of mental health diagnosis and concerns, Identify triggers  associated with mental health/substance abuse issues, and Increase skills for wellness and recovery  Therapeutic Interventions: Assess for all discharge needs, 1 to 1 time with Social worker, Explore available resources and support systems, Assess for adequacy in community support network, Educate family and significant other(s) on suicide prevention, Complete Psychosocial Assessment, Interpersonal group therapy.  Evaluation of Outcomes: Progressing   Progress in Treatment: Attending groups: No. Participating in groups: No. Taking medication as prescribed: Yes. Toleration medication: Yes. Family/Significant other contact made: Yes, individual(s) contacted:  pt's aunt Patient understands diagnosis: No. Discussing patient identified problems/goals with staff: Yes. Medical problems stabilized or resolved: Yes. Denies suicidal/homicidal ideation: Yes. Issues/concerns per patient self-inventory: No.   New problem(s) identified: No, Describe:  None   New Short Term/Long Term Goal(s): medication stabilization, elimination of SI thoughts, development of comprehensive mental wellness plan.   Patient Goals: "To go home"   Discharge Plan or Barriers: Patient recently admitted. CSW will continue to follow and assess for appropriate referrals and possible discharge planning. Pt will return home with her parents at the time of discharge.   Reason for Continuation of Hospitalization: Anxiety Hallucinations Medication stabilization  Estimated Length of Stay: 3 to 5 days   Attendees: Patient: Monica Richardson 04/22/2021   Physician: Dr. Renaldo Fiddler 04/22/2021   Nursing:  04/22/2021   RN Care Manager: 04/22/2021   Social Worker: Ruthann Cancer, LCSW 04/22/2021   Recreational Therapist:  04/22/2021   Other:  04/22/2021   Other:  04/22/2021  Other: 04/22/2021     Scribe for Treatment Team: Felizardo Hoffmann, Theresia Majors 04/21/2021 2:20 PM

## 2021-04-21 NOTE — Progress Notes (Signed)
   04/21/21 2020  Psych Admission Type (Psych Patients Only)  Admission Status Involuntary  Psychosocial Assessment  Patient Complaints Irritability  Eye Contact Brief;Avertive  Facial Expression Anxious  Affect Irritable  Speech Elective mutism;Argumentative  Interaction Avoidant;Guarded;Minimal  Motor Activity Slow  Appearance/Hygiene In scrubs  Behavior Characteristics Anxious;Guarded;Irritable  Mood Irritable  Thought Process  Coherency Circumstantial  Content Blaming others  Delusions None reported or observed  Perception WDL  Hallucination None reported or observed  Judgment Poor  Confusion None  Danger to Self  Current suicidal ideation? Denies  Danger to Others  Danger to Others None reported or observed   Pt still minimal in interaction. Did come in dayroom for group. Speaks with peers on occasion. Electively mute with some staff. Still argumentative when it comes to medications. Took meds PO and asked to stand at med window for a few minutes to ensure dissolvable tablet was gone. Mouth check showed no pill. Pt still irritable about need for meds.
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
## 2021-04-21 NOTE — Progress Notes (Signed)
   04/20/21 2101  Psych Admission Type (Psych Patients Only)  Admission Status Involuntary  Psychosocial Assessment  Patient Complaints Irritability  Eye Contact Avertive;Brief  Facial Expression Angry  Affect Irritable  Speech Argumentative;Logical/coherent;Elective mutism  Interaction Avoidant;Guarded;Minimal  Motor Activity Other (Comment) (WDL)  Appearance/Hygiene In scrubs  Behavior Characteristics Irritable;Agitated  Mood Labile;Irritable  Thought Process  Coherency Tangential  Content Blaming others  Delusions None reported or observed  Perception Derealization  Hallucination None reported or observed  Judgment Poor  Confusion None  Danger to Self  Current suicidal ideation? Denies  Danger to Others  Danger to Others None reported or observed

## 2021-04-21 NOTE — Progress Notes (Addendum)
D: Patient denies SI/HI/AVH. Patient denies anxiety and depression. Patient angry and labile. Pt. Stated "my mom said that I shouldn't take any meds" Pt. Tried to hide her med in her mouth. Pt. Was labile in the afternoon, swearing and yelling on the phone. Pt. Refused vital signs x3. Pt. Was isolative in her room.  A:  Patient took scheduled medicine.  Support and encouragement provided Routine safety checks conducted every 15 minutes. Patient  Informed to notify staff with any concerns.   R:  Safety maintained.

## 2021-04-21 NOTE — Progress Notes (Signed)
Pt did not attend orientation/goals group. 

## 2021-04-22 LAB — URINALYSIS, ROUTINE W REFLEX MICROSCOPIC
Bacteria, UA: NONE SEEN
Bilirubin Urine: NEGATIVE
Glucose, UA: NEGATIVE mg/dL
Hgb urine dipstick: NEGATIVE
Ketones, ur: 5 mg/dL — AB
Nitrite: NEGATIVE
Protein, ur: NEGATIVE mg/dL
Specific Gravity, Urine: 1.012 (ref 1.005–1.030)
pH: 7 (ref 5.0–8.0)

## 2021-04-22 LAB — VALPROIC ACID LEVEL: Valproic Acid Lvl: 57 ug/mL (ref 50.0–100.0)

## 2021-04-22 NOTE — Progress Notes (Signed)
[facility] MD Progress Note  04/22/2021 12:46 PM Jacqueline Spofford  MRN:  [id number] Subjective:   Monica Richardson is a 20 yr old female who presents under IVC for paranoia and delusional thoughts. PPHx is significant for previous IVC in March 2021 due to aggression.   Today when attempting to interview patient she sat on the bed staring at the wall and would not answer.  She merely shrugged her shoulders or shook her head yes or no to all questions.   She expressed that she was doing so-so today.  She expressed her sleep was so-so.  She expressed her appetite is doing okay.  She reports no SI, HI, or AVH.  Praised her for continuing to take her oral medications.  Encouraged her to continue with this as this will be what allows her to discharge sooner.  When asked if she had any questions she shrugged her shoulders and shook her head no.  She has no other concerns at present.   Principal Problem: Schizophrenia spectrum disorder with psychotic disorder type not yet determined (HCC) Diagnosis: Principal Problem:   Schizophrenia spectrum disorder with psychotic disorder type not yet determined [facility]) Active Problems:   Aggressive behavior   Cannabis abuse  Total Time spent with patient: 30 minutes  Past Psychiatric History: IVC in March 2021 due to aggression  Past Medical History: History reviewed. No pertinent past medical history. History reviewed. No pertinent surgical history. Family History: History reviewed. No pertinent family history. Family Psychiatric  History: None Social History:  Social History   Substance and Sexual Activity  Alcohol Use No     Social History   Substance and Sexual Activity  Drug Use Yes   Types: Marijuana    Social History   Socioeconomic History   Marital status: Single    Spouse name: Not on file   Number of children: Not on file   Years of education: Not on file   Highest education level: Not on file  Occupational History   Not on file  Tobacco Use    Smoking status: Never   Smokeless tobacco: Never  Substance and Sexual Activity   Alcohol use: No   Drug use: Yes    Types: Marijuana   Sexual activity: Not on file  Other Topics Concern   Not on file  Social History Narrative   Not on file   Social Determinants of Health   Financial Resource Strain: Not on file  Food Insecurity: Not on file  Transportation Needs: Not on file  Physical Activity: Not on file  Stress: Not on file  Social Connections: Not on file   Additional Social History:                         Sleep: Fair  Appetite:  Fair  Current Medications: Current Facility-Administered Medications  Medication Dose Route Frequency Provider Last Rate Last Admin   acetaminophen (TYLENOL) tablet 650 mg  650 mg Oral Q6H PRN Jaclyn Shaggy, PA-C       alum & mag hydroxide-simeth (MAALOX/MYLANTA) 200-200-20 MG/5ML suspension 30 mL  30 mL Oral Q4H PRN Melbourne Abts W, PA-C       diphenhydrAMINE (BENADRYL) capsule 25 mg  25 mg Oral Q6H PRN Mason Jim, Amy E, MD       Or   diphenhydrAMINE (BENADRYL) injection 25 mg  25 mg Intramuscular Q6H PRN Mason Jim, Amy E, MD   25 mg at 04/17/21 1135   feeding supplement (ENSURE  ENLIVE / ENSURE PLUS) liquid 237 mL  237 mL Oral BID BM Laveda Abbe, NP   237 mL at 04/16/21 1521   valproic acid (DEPAKENE) 250 MG/5ML solution 500 mg  500 mg Oral BID Lauro Franklin, MD   500 mg at 04/21/21 2038   Or   LORazepam (ATIVAN) injection 1 mg  1 mg Intravenous BID Lauro Franklin, MD       OLANZapine zydis (ZYPREXA) disintegrating tablet 5 mg  5 mg Oral Q8H PRN Comer Locket, MD       And   LORazepam (ATIVAN) tablet 1 mg  1 mg Oral PRN Comer Locket, MD       And   ziprasidone (GEODON) injection 20 mg  20 mg Intramuscular PRN Mason Jim, Amy E, MD       magnesium hydroxide (MILK OF MAGNESIA) suspension 30 mL  30 mL Oral Daily PRN Melbourne Abts W, PA-C       risperiDONE (RISPERDAL M-TABS) disintegrating tablet 2 mg   2 mg Oral Daily Lauro Franklin, MD   2 mg at 04/22/21 7893   risperiDONE (RISPERDAL M-TABS) disintegrating tablet 3 mg  3 mg Oral QHS Lauro Franklin, MD   3 mg at 04/21/21 2038   traZODone (DESYREL) tablet 50 mg  50 mg Oral QHS PRN Jaclyn Shaggy, PA-C        Lab Results:  Results for orders placed or performed during the hospital encounter of 04/12/21 (from the past 48 hour(s))  Valproic acid level     Status: None   Collection Time: 04/22/21  6:26 AM  Result Value Ref Range   Valproic Acid Lvl 57 50.0 - 100.0 ug/mL    Comment: Performed at [facility], [address], [place], [place] [address]  Urinalysis, Routine w reflex microscopic PATH Cytology Urine     Status: Abnormal   Collection Time: 04/22/21  7:13 AM  Result Value Ref Range   Color, Urine STRAW (A) YELLOW   APPearance CLEAR CLEAR   Specific Gravity, Urine 1.012 1.005 - 1.030   pH 7.0 5.0 - 8.0   Glucose, UA NEGATIVE NEGATIVE mg/dL   Hgb urine dipstick NEGATIVE NEGATIVE   Bilirubin Urine NEGATIVE NEGATIVE   Ketones, ur 5 (A) NEGATIVE mg/dL   Protein, ur NEGATIVE NEGATIVE mg/dL   Nitrite NEGATIVE NEGATIVE   Leukocytes,Ua TRACE (A) NEGATIVE   RBC / HPF 0-5 0 - 5 RBC/hpf   WBC, UA 0-5 0 - 5 WBC/hpf   Bacteria, UA NONE SEEN NONE SEEN   Squamous Epithelial / LPF 0-5 0 - 5   Mucus PRESENT     Comment: Performed at [facility], [address], [place], [place] [address]    Blood Alcohol level:  Lab Results  Component Value Date   ETH <10 12/05/2019   ETH <10 10/13/2017    Metabolic Disorder Labs: Lab Results  Component Value Date   HGBA1C 5.4 04/12/2021   MPG 108.28 04/12/2021   No results found for: PROLACTIN Lab Results  Component Value Date   CHOL 201 (H) 04/12/2021   TRIG 58 04/12/2021   HDL 51 04/12/2021   CHOLHDL 3.9 04/12/2021   VLDL 12 04/12/2021   LDLCALC 138 (H) 04/12/2021    Physical Findings: AIMS: Facial and Oral Movements Muscles of  Facial Expression: None, normal Lips and Perioral Area: None, normal Jaw: None, normal Tongue: None, normal,Extremity Movements Upper (arms, wrists, hands, fingers): None, normal Lower (  legs, knees, ankles, toes): None, normal, Trunk Movements Neck, shoulders, hips: None, normal, Overall Severity Severity of abnormal movements (highest score from questions above): None, normal Incapacitation due to abnormal movements: None, normal Patient's awareness of abnormal movements (rate only patient's report): No Awareness, Dental Status Current problems with teeth and/or dentures?: No Does patient usually wear dentures?: No  CIWA:    COWS:     Musculoskeletal: Strength & Muscle Tone: within normal limits Gait & Station: normal Patient leans: N/A  Psychiatric Specialty Exam:  Presentation  General Appearance: Appropriate for Environment; Casual; Fairly Groomed  Eye Contact:Minimal  Speech:-- (only shook her head yes/no)  Speech Volume:-- (only shook head yes/no)  Handedness:Right   Mood and Affect  Mood:Depressed; Hopeless  Affect:Flat   Thought Process  Thought Processes:-- (cannot determine because only shook head yes/no)  Descriptions of Associations:-- (unable to assess due to only shaking head yes/no)  Orientation:-- (unable to assess due to only shaking head yes/no)  Thought Content:Logical; WDL  History of Schizophrenia/Schizoaffective disorder:No  Duration of Psychotic Symptoms:Less than six months  Hallucinations:Hallucinations: None  Ideas of Reference:-- (unable to assess due to only shaking head yes/no)  Suicidal Thoughts:Suicidal Thoughts: No  Homicidal Thoughts:Homicidal Thoughts: No   Sensorium  Memory:-- (unable to assess due to only shaking head yes/no)  Judgment:Impaired  Insight:Lacking   Executive Functions  Concentration:Fair  Attention Span:Fair  Recall:-- (unable to assess due to only shaking head yes/no)  Fund of Knowledge:--  (unable to assess due to only shaking head yes/no)  Language:-- (unable to assess due to only shaking head yes/no)   Psychomotor Activity  Psychomotor Activity:Psychomotor Activity: Normal   Assets  Assets:Resilience; Social Support; Physical Health   Sleep  Sleep:Sleep: Good Number of Hours of Sleep: 8    Physical Exam: Physical Exam Vitals and nursing note reviewed.  Constitutional:      General: She is not in acute distress.    Appearance: Normal appearance. She is normal weight. She is not ill-appearing or toxic-appearing.  HENT:     Head: Normocephalic and atraumatic.  Pulmonary:     Effort: Pulmonary effort is normal.  Musculoskeletal:        General: Normal range of motion.  Neurological:     General: No focal deficit present.     Mental Status: She is alert.   Review of Systems  Respiratory:  Negative for cough and shortness of breath.   Cardiovascular:  Negative for chest pain.  Gastrointestinal:  Negative for abdominal pain, constipation, diarrhea, nausea and vomiting.  Neurological:  Negative for weakness and headaches.  Psychiatric/Behavioral:  Negative for depression, hallucinations and suicidal ideas. The patient is not nervous/anxious.   Blood pressure 126/90, pulse (!) 104, temperature (!) 97.5 F (36.4 C), temperature source Oral, resp. rate 17, height 5\' 7"  (1.702 m), weight 54.4 kg, SpO2 100 %. Body mass index is 18.79 kg/m.   Treatment Plan Summary: Daily contact with patient to assess and evaluate symptoms and progress in treatment   Monica Richardson is a 20 yr old female who presents under IVC for paranoia and delusional thoughts. PPHx is significant for previous IVC in March 2021 due to aggression.     It appears that now her behavioral issues are the driving force.  When interviewed she will stay curled up in a ball on the bed or not speak at all however will be seen by staff later arguing on the phone yelling and acting childish.  We will plan  to get lab  work on Sunday and plan for discharge on Monday.  We will not make any changes at this time in her medications.  We will continue to monitor.     Acute Psychosis: -Continue Risperdal M-Tabs 2 mg AM and 3 mg QHS -Continue Depakene 500 mg BID or Ativan 1 mg IM if refuses PO -Continue Benadryl 25 mg PO or IM q6 PRN -Continue Agitation Protocol:Zyprexa/Ativan/Geodon -Head CT pending when patient can cooperate for neuroimaging     Hypokalemia(resolved):     Nutritional support: -Continue Ensure BID between meals     -Continue PRN's: Tylenol, Maalox, Atarax, Milk of Magnesia, Trazodone     Lauro Franklin, MD 04/22/2021, 12:46 PM

## 2021-04-22 NOTE — Progress Notes (Addendum)
   04/22/21 2015  Psych Admission Type (Psych Patients Only)  Admission Status Involuntary  Psychosocial Assessment  Patient Complaints None  Eye Contact Brief;Avertive  Facial Expression Anxious  Affect Depressed  Speech Elective mutism;Logical/coherent  Interaction Avoidant;Guarded;Minimal  Motor Activity Slow  Appearance/Hygiene In scrubs  Behavior Characteristics Cooperative;Anxious  Mood Anxious;Depressed  Thought Process  Coherency Circumstantial  Content WDL  Delusions None reported or observed  Perception WDL  Hallucination None reported or observed  Judgment Poor  Confusion None  Danger to Self  Current suicidal ideation? Denies  Danger to Others  Danger to Others None reported or observed   Pt seen in dayroom. Pt answering questions. Denies SI, HI, AVH and pain. Believes she is being discharged tomorrow. Encouraged pt to keep taking her meds, avoiding negative behavior and going to groups to be ready for discharge whenever it happens. Pt took meds this evening without argument.

## 2021-04-22 NOTE — Progress Notes (Signed)
Dar Note: Patient presents with irritable affect and mood.  Refused to participate during assessment.  Medication given as prescribed.  Patient attempted to spit her medication out but swallowed it with encouragement.  Routine safety checks maintained.  Patient became angry and started yelling after receiving Ativan injection.  Patient was redirected several times with good effect.  Patient is safe on the unit at this time.

## 2021-04-22 NOTE — Progress Notes (Signed)
[facility] Group Notes:  (Nursing/MHT/Case Management/Adjunct)  Date:  04/22/2021  Time:  9:06 PM  Type of Therapy:  Psychoeducational Skills  Participation Level:  Active  Participation Quality:  Appropriate and Sharing  Affect:  Appropriate  Cognitive:  Alert, Appropriate, and Oriented  Insight:  Appropriate and Good  Engagement in Group:  Engaged  Modes of Intervention:  Problem-solving  Summary of Progress/Problems: Pt. Shared with the group how she was excited to see people who she started out with get better and discharge home.  She continued to share how she is looking forward to returning home as well.    Monica Richardson [place] 04/22/2021, 9:06 PM

## 2021-04-22 NOTE — Progress Notes (Signed)
Received a phone call from pt's aunt, Forrest Moron. Asking about pt mood and behavior today. Aunt thinks pt lack of appetite is due to medications because pt has never been on these type of meds before. Aunt agreed to be called to calm pt down if pt becomes a behavior issue again.

## 2021-04-23 NOTE — Progress Notes (Signed)
Pt did not attend orientation/goals group. 

## 2021-04-23 NOTE — Progress Notes (Signed)
   04/23/21 1000  Psych Admission Type (Psych Patients Only)  Admission Status Involuntary  Psychosocial Assessment  Patient Complaints None  Eye Contact Brief;Avertive  Facial Expression Anxious  Affect Depressed  Speech Elective mutism;Logical/coherent  Interaction Avoidant;Guarded;Minimal  Motor Activity Slow  Appearance/Hygiene In scrubs  Behavior Characteristics Unwilling to participate  Mood Preoccupied  Thought Process  Coherency Circumstantial  Content WDL  Delusions None reported or observed  Perception WDL  Hallucination None reported or observed  Judgment Poor  Confusion None  Danger to Self  Current suicidal ideation? Denies  Danger to Others  Danger to Others None reported or observed

## 2021-04-23 NOTE — [facility] Group Notes (Signed)
  [facility] LCSW Group Therapy Note  Date/Time:  04/23/2021 11:15AM-12:00PM  Type of Therapy and Topic:  Group Therapy:  Feelings About Hospitalization  Participation Level:  Did Not Attend   Description of Group This process group involved patients discussing their feelings related to being hospitalized, as well as the benefits they see to being in the hospital.  These feelings and benefits were itemized.  The group then brainstormed specific ways in which they could seek those same benefits when they discharge and return home.  Therapeutic Goals Patient will identify and describe positive and negative feelings related to hospitalization Patient will verbalize benefits of hospitalization to themselves personally Patients will brainstorm together ways they can obtain similar benefits in the outpatient setting, identify barriers to wellness and possible solutions  Summary of Patient Progress:  The patient was invited to group, did not attend.  Therapeutic Modalities Cognitive Behavioral Therapy Motivational Interviewing    Ambrose Mantle, LCSW 04/23/2021, 9:49 AM

## 2021-04-23 NOTE — Progress Notes (Signed)
Adult Psychoeducational Group Note  Date:  04/23/2021 Time:  10:30 PM  Group Topic/Focus:  Wrap-Up Group:   The focus of this group is to help patients review their daily goal of treatment and discuss progress on daily workbooks.  Participation Level:  Did Not Attend  Participation Quality:   Did Not Attend  Affect:   Did Not Attend  Cognitive:   Did Not Attend  Insight: None  Engagement in Group:  Did Not Attend  Modes of Intervention:   Did Not Attend  Additional Comments:  Pt did not attend evening wrap up group tonight.  [name] 04/23/2021, 10:30 PM

## 2021-04-23 NOTE — Progress Notes (Signed)
[facility] MD Progress Note  04/23/2021 10:24 AM Monica Richardson  MRN:  [id number] Subjective:   Monica Richardson is a 20 yr old female who presents under IVC for paranoia and delusional thoughts. PPHx is significant for previous IVC in March 2021 due to aggression.   Today when I interviewed the patient she had just gotten off the telephone.  She was much more interactive today still somewhat guarded but more open.   She reports that she is doing okay today.  She reports that she slept well last night.  She reports that her appetite is doing okay.  She reports no SI, HI, or AVH.  She asked if she was being discharged today because she stated someone told her that yesterday.  Discussed with her that that would not be possible as I needed to check blood work on her.  Discussed that if she continued to do well behavior wise and take her medications I would order the blood work for tomorrow morning and that tomorrow would be a possible discharge.  She understood this and had no questions about it.  She reports no side effects from her medications.  She reports no other concerns at present.    Principal Problem: Schizophrenia spectrum disorder with psychotic disorder type not yet determined (HCC) Diagnosis: Principal Problem:   Schizophrenia spectrum disorder with psychotic disorder type not yet determined [facility]) Active Problems:   Aggressive behavior   Cannabis abuse  Total Time spent with patient: 30 minutes  Past Psychiatric History: IVC in March 2021 due to aggression  Past Medical History: History reviewed. No pertinent past medical history. History reviewed. No pertinent surgical history. Family History: History reviewed. No pertinent family history. Family Psychiatric  History: None Social History:  Social History   Substance and Sexual Activity  Alcohol Use No     Social History   Substance and Sexual Activity  Drug Use Yes   Types: Marijuana    Social History   Socioeconomic History    Marital status: Single    Spouse name: Not on file   Number of children: Not on file   Years of education: Not on file   Highest education level: Not on file  Occupational History   Not on file  Tobacco Use   Smoking status: Never   Smokeless tobacco: Never  Substance and Sexual Activity   Alcohol use: No   Drug use: Yes    Types: Marijuana   Sexual activity: Not on file  Other Topics Concern   Not on file  Social History Narrative   Not on file   Social Determinants of Health   Financial Resource Strain: Not on file  Food Insecurity: Not on file  Transportation Needs: Not on file  Physical Activity: Not on file  Stress: Not on file  Social Connections: Not on file   Additional Social History:                         Sleep: Good  Appetite:  Good  Current Medications: Current Facility-Administered Medications  Medication Dose Route Frequency Provider Last Rate Last Admin   acetaminophen (TYLENOL) tablet 650 mg  650 mg Oral Q6H PRN Jaclyn Shaggy, PA-C       alum & mag hydroxide-simeth (MAALOX/MYLANTA) 200-200-20 MG/5ML suspension 30 mL  30 mL Oral Q4H PRN Melbourne Abts W, PA-C       diphenhydrAMINE (BENADRYL) capsule 25 mg  25 mg Oral Q6H PRN Mason Jim, Amy  E, MD       Or   diphenhydrAMINE (BENADRYL) injection 25 mg  25 mg Intramuscular Q6H PRN Mason Jim, Amy E, MD   25 mg at 04/17/21 1135   feeding supplement (ENSURE ENLIVE / ENSURE PLUS) liquid 237 mL  237 mL Oral BID BM Laveda Abbe, NP   237 mL at 04/16/21 1521   valproic acid (DEPAKENE) 250 MG/5ML solution 500 mg  500 mg Oral BID Lauro Franklin, MD   500 mg at 04/23/21 0831   Or   LORazepam (ATIVAN) injection 1 mg  1 mg Intravenous BID Lauro Franklin, MD       OLANZapine zydis (ZYPREXA) disintegrating tablet 5 mg  5 mg Oral Q8H PRN Comer Locket, MD       And   LORazepam (ATIVAN) tablet 1 mg  1 mg Oral PRN Comer Locket, MD       And   ziprasidone (GEODON) injection 20  mg  20 mg Intramuscular PRN Mason Jim, Amy E, MD       magnesium hydroxide (MILK OF MAGNESIA) suspension 30 mL  30 mL Oral Daily PRN Ladona Ridgel, Cody W, PA-C       risperiDONE (RISPERDAL M-TABS) disintegrating tablet 2 mg  2 mg Oral Daily Lauro Franklin, MD   2 mg at 04/23/21 0831   risperiDONE (RISPERDAL M-TABS) disintegrating tablet 3 mg  3 mg Oral QHS Lauro Franklin, MD   3 mg at 04/22/21 2055   traZODone (DESYREL) tablet 50 mg  50 mg Oral QHS PRN Jaclyn Shaggy, PA-C        Lab Results:  Results for orders placed or performed during the hospital encounter of 04/12/21 (from the past 48 hour(s))  Valproic acid level     Status: None   Collection Time: 04/22/21  6:26 AM  Result Value Ref Range   Valproic Acid Lvl 57 50.0 - 100.0 ug/mL    Comment: Performed at [facility], [address], [place], [place] [address]  Urinalysis, Routine w reflex microscopic PATH Cytology Urine     Status: Abnormal   Collection Time: 04/22/21  7:13 AM  Result Value Ref Range   Color, Urine STRAW (A) YELLOW   APPearance CLEAR CLEAR   Specific Gravity, Urine 1.012 1.005 - 1.030   pH 7.0 5.0 - 8.0   Glucose, UA NEGATIVE NEGATIVE mg/dL   Hgb urine dipstick NEGATIVE NEGATIVE   Bilirubin Urine NEGATIVE NEGATIVE   Ketones, ur 5 (A) NEGATIVE mg/dL   Protein, ur NEGATIVE NEGATIVE mg/dL   Nitrite NEGATIVE NEGATIVE   Leukocytes,Ua TRACE (A) NEGATIVE   RBC / HPF 0-5 0 - 5 RBC/hpf   WBC, UA 0-5 0 - 5 WBC/hpf   Bacteria, UA NONE SEEN NONE SEEN   Squamous Epithelial / LPF 0-5 0 - 5   Mucus PRESENT     Comment: Performed at [facility], [address], [place], [place] [address]    Blood Alcohol level:  Lab Results  Component Value Date   [facility] <10 12/05/2019   ETH <10 10/13/2017    Metabolic Disorder Labs: Lab Results  Component Value Date   HGBA1C 5.4 04/12/2021   MPG 108.28 04/12/2021   No results found for: PROLACTIN Lab Results  Component Value  Date   CHOL 201 (H) 04/12/2021   TRIG 58 04/12/2021   HDL 51 04/12/2021   CHOLHDL 3.9 04/12/2021   VLDL 12 04/12/2021   LDLCALC 138 (H)  04/12/2021    Physical Findings: AIMS: Facial and Oral Movements Muscles of Facial Expression: None, normal Lips and Perioral Area: None, normal Jaw: None, normal Tongue: None, normal,Extremity Movements Upper (arms, wrists, hands, fingers): None, normal Lower (legs, knees, ankles, toes): None, normal, Trunk Movements Neck, shoulders, hips: None, normal, Overall Severity Severity of abnormal movements (highest score from questions above): None, normal Incapacitation due to abnormal movements: None, normal Patient's awareness of abnormal movements (rate only patient's report): No Awareness, Dental Status Current problems with teeth and/or dentures?: No Does patient usually wear dentures?: No  CIWA:    COWS:     Musculoskeletal: Strength & Muscle Tone: within normal limits Gait & Station: normal Patient leans: N/A  Psychiatric Specialty Exam:  Presentation  General Appearance: Appropriate for Environment; Fairly Groomed; Casual  Eye Contact:Fair  Speech:Clear and Coherent; Normal Rate  Speech Volume:Normal  Handedness:Right   Mood and Affect  Mood:Depressed  Affect:Flat   Thought Process  Thought Processes:Coherent; Goal Directed  Descriptions of Associations:Intact  Orientation:Full (Time, Place and Person)  Thought Content:Logical; WDL  History of Schizophrenia/Schizoaffective disorder:No  Duration of Psychotic Symptoms:Less than six months  Hallucinations:Hallucinations: None  Ideas of Reference:None  Suicidal Thoughts:Suicidal Thoughts: No  Homicidal Thoughts:Homicidal Thoughts: No   Sensorium  Memory:Immediate Fair; Recent Fair  Judgment:Fair  Insight:Poor   Executive Functions  Concentration:Fair  Attention Span:Fair  Recall:Fair  Fund of Knowledge:Fair  Language:Fair   Psychomotor  Activity  Psychomotor Activity:Psychomotor Activity: Normal   Assets  Assets:Resilience; Social Support; Physical Health   Sleep  Sleep:Sleep: Good Number of Hours of Sleep: 7    Physical Exam: Physical Exam Vitals and nursing note reviewed.  Constitutional:      General: She is not in acute distress.    Appearance: Normal appearance. She is normal weight. She is not ill-appearing or toxic-appearing.  HENT:     Head: Normocephalic and atraumatic.  Pulmonary:     Effort: Pulmonary effort is normal.  Musculoskeletal:        General: Normal range of motion.  Neurological:     General: No focal deficit present.     Mental Status: She is alert.   Review of Systems  Respiratory:  Negative for cough and shortness of breath.   Cardiovascular:  Negative for chest pain.  Gastrointestinal:  Negative for abdominal pain, constipation, diarrhea, nausea and vomiting.  Neurological:  Negative for weakness and headaches.  Psychiatric/Behavioral:  Negative for depression, hallucinations and suicidal ideas. The patient is not nervous/anxious.   Blood pressure 111/70, pulse (!) 128, temperature (!) 97.5 F (36.4 C), temperature source Oral, resp. rate 17, height 5\' 7"  (1.702 m), weight 54.4 kg, SpO2 100 %. Body mass index is 18.79 kg/m.   Treatment Plan Summary: Daily contact with patient to assess and evaluate symptoms and progress in treatment   Monica Richardson is a 20 yr old female who presents under IVC for paranoia and delusional thoughts. PPHx is significant for previous IVC in March 2021 due to aggression.     Patient is improving with the medications and her behavior is also improving with knowledge that it is what will help get her discharged quicker.  She had an okay day yesterday.  We will watch for continued good behavior today and as long as labs tomorrow morning are good we will plan for discharge tomorrow.  Have ordered CBC, hepatic function, and Depakote level.  We will not  make any medication changes at this time.  We will continue to monitor.  Acute Psychosis: -Continue Risperdal M-Tabs 2 mg AM and 3 mg QHS -Continue Depakene 500 mg BID or Ativan 1 mg IM if refuses PO -Continue Benadryl 25 mg PO or IM q6 PRN -Continue Agitation Protocol:Zyprexa/Ativan/Geodon -Head CT pending when patient can cooperate for neuroimaging -Draw CBC w/ diff, Hepatic Function, and Depakote level tomorrow AM     Hypokalemia(resolved):     Nutritional support: -Continue Ensure BID between meals     -Continue PRN's: Tylenol, Maalox, Atarax, Milk of Magnesia, Trazodone     Lauro FranklinAlexander S Eddison Searls, MD 04/23/2021, 10:24 AM

## 2021-04-23 NOTE — Progress Notes (Signed)
Pt did not attend relaxation group.  

## 2021-04-23 NOTE — Progress Notes (Signed)
Patient got upset about having to take the liquid Depakote and asked to take the pill form. She was hesitant but eventually took it. She was later heard arguing with someone on the phone and continued to get louder after writer asked her 3 times or more to lower her voice.  04/23/21 2141  Psych Admission Type (Psych Patients Only)  Admission Status Involuntary  Psychosocial Assessment  Patient Complaints None  Eye Contact Brief;Avertive  Facial Expression Anxious  Affect Depressed  Speech Elective mutism;Soft  Interaction Avoidant;Guarded;Minimal  Motor Activity Slow  Appearance/Hygiene In scrubs  Behavior Characteristics Agitated;Unwilling to participate  Mood Preoccupied;Angry  Aggressive Behavior  Targets Other (Comment) (staff)  Type of Behavior Verbal;Unprovoked  Effect No apparent injury  Thought Process  Coherency Circumstantial  Content WDL  Delusions None reported or observed  Perception WDL  Hallucination None reported or observed  Judgment Poor  Confusion None  Danger to Self  Current suicidal ideation? Denies  Danger to Others  Danger to Others None reported or observed  Danger to Others Abnormal  Harmful Behavior to others Threats of violence towards other people observed or expressed   Destructive Behavior No threats or harm toward property

## 2021-04-24 LAB — CBC WITH DIFFERENTIAL/PLATELET
Abs Immature Granulocytes: 0.01 K/uL (ref 0.00–0.07)
Basophils Absolute: 0 K/uL (ref 0.0–0.1)
Basophils Relative: 1 %
Eosinophils Absolute: 0.1 K/uL (ref 0.0–0.5)
Eosinophils Relative: 2 %
HCT: 37.1 % (ref 36.0–46.0)
Hemoglobin: 11.9 g/dL — ABNORMAL LOW (ref 12.0–15.0)
Immature Granulocytes: 0 %
Lymphocytes Relative: 42 %
Lymphs Abs: 1.8 K/uL (ref 0.7–4.0)
MCH: 27.6 pg (ref 26.0–34.0)
MCHC: 32.1 g/dL (ref 30.0–36.0)
MCV: 86.1 fL (ref 80.0–100.0)
Monocytes Absolute: 0.4 K/uL (ref 0.1–1.0)
Monocytes Relative: 10 %
Neutro Abs: 2 K/uL (ref 1.7–7.7)
Neutrophils Relative %: 45 %
Platelets: 183 K/uL (ref 150–400)
RBC: 4.31 MIL/uL (ref 3.87–5.11)
RDW: 15.9 % — ABNORMAL HIGH (ref 11.5–15.5)
WBC: 4.2 K/uL (ref 4.0–10.5)
nRBC: 0 % (ref 0.0–0.2)

## 2021-04-24 LAB — COMPREHENSIVE METABOLIC PANEL WITH GFR
ALT: 14 U/L (ref 0–44)
AST: 25 U/L (ref 15–41)
Albumin: 4.3 g/dL (ref 3.5–5.0)
Alkaline Phosphatase: 48 U/L (ref 38–126)
Anion gap: 10 (ref 5–15)
BUN: 11 mg/dL (ref 6–20)
CO2: 25 mmol/L (ref 22–32)
Calcium: 9.5 mg/dL (ref 8.9–10.3)
Chloride: 106 mmol/L (ref 98–111)
Creatinine, Ser: 0.87 mg/dL (ref 0.44–1.00)
GFR, Estimated: >60 mL/min
Glucose, Bld: 112 mg/dL — ABNORMAL HIGH (ref 70–99)
Potassium: 4 mmol/L (ref 3.5–5.1)
Sodium: 141 mmol/L (ref 135–145)
Total Bilirubin: 0.6 mg/dL (ref 0.3–1.2)
Total Protein: 7.3 g/dL (ref 6.5–8.1)

## 2021-04-24 LAB — VALPROIC ACID LEVEL: Valproic Acid Lvl: 100 ug/mL (ref 50.0–100.0)

## 2021-04-24 MED ORDER — RISPERIDONE 2 MG PO TABS: 2.0000 mg | ORAL_TABLET | Freq: Every day | ORAL | 0 refills | Status: DC

## 2021-04-24 MED ORDER — RISPERIDONE 2 MG PO TABS
2.0000 mg | ORAL_TABLET | Freq: Every day | ORAL | Status: DC
  Filled 2021-04-24: qty 1

## 2021-04-24 MED ORDER — DIVALPROEX SODIUM 500 MG PO DR TAB
500.0000 mg | DELAYED_RELEASE_TABLET | Freq: Two times a day (BID) | ORAL | Status: DC
  Filled 2021-04-24 (×2): qty 1

## 2021-04-24 MED ORDER — RISPERIDONE 3 MG PO TABS: 3.0000 mg | ORAL_TABLET | Freq: Every day | ORAL | 0 refills | Status: DC

## 2021-04-24 MED ORDER — RISPERIDONE 3 MG PO TABS
3.0000 mg | ORAL_TABLET | Freq: Every day | ORAL | Status: DC
  Filled 2021-04-24: qty 1

## 2021-04-24 MED ORDER — DIVALPROEX SODIUM 500 MG PO DR TAB: 500.0000 mg | DELAYED_RELEASE_TABLET | Freq: Two times a day (BID) | ORAL | 0 refills | Status: DC

## 2021-04-24 NOTE — [facility] Group Notes (Signed)
[facility] LCSW Group Therapy Note  Date/Time:  04/24/2021  11:00AM-12:00PM  Type of Therapy and Topic:  Group Therapy:  Music and Mood  Participation Level:  None   Description of Group: In this process group, members listened to a variety of genres of music and identified that different types of music evoke different responses.  Patients were encouraged to identify music that was soothing for them and music that was energizing for them.  Patients discussed how this knowledge can help with wellness and recovery in various ways including managing depression and anxiety as well as encouraging healthy sleep habits.    Therapeutic Goals: Patients will explore the impact of different varieties of music on mood Patients will verbalize the thoughts they have when listening to different types of music Patients will identify music that is soothing to them as well as music that is energizing to them Patients will discuss how to use this knowledge to assist in maintaining wellness and recovery Patients will explore the use of music as a coping skill  Summary of Patient Progress:  At the beginning of group, patient was not present.  She came in for a few minutes, listened to 2 songs, then left again.  Therapeutic Modalities: Solution Focused Brief Therapy Activity   Ambrose Mantle, LCSW

## 2021-04-24 NOTE — [facility] Counselor (Signed)
Clinical Social Work Note  CSW received return phone call from patient's aunt who had just spoken with nurse.  She had additional questions.  CSW explained about patient's refusal for follow-up appointments and the information for a walk-clinic that was provided to her, as well as her medicines and the provision of 30 days of scripts for those medicines.  She stated that patient will not get the medicines filled and will not go for follow-up.  Ambrose Mantle, LCSW 04/24/2021, 1:02 PM

## 2021-04-24 NOTE — Progress Notes (Signed)
  [facility] Adult Case Management Discharge Plan :  Will you be returning to the same living situation after discharge:  Yes,  with family At discharge, do you have transportation home?: Yes,  family Do you have the ability to pay for your medications: Yes,  insurance  Release of information consent forms completed and emailed to Medical Records, then turned in to Medical Records by CSW.   Patient to Follow up at:  Follow-up Information     [facility] Follow up.   Why: If you decide to pursue follow-up treatment, you can go to this agency as a walk-in or call ahead for appointments.  They can provide both medication management and therapy. Contact information: [address] [place] [address] [phone]                 Next level of care provider has access to [facility] Link:no  Safety Planning and Suicide Prevention discussed: No.     Has patient been referred to the Quitline?: Patient refused referral  Patient has been referred for addiction treatment: N/A  Lynnell Chad, LCSW 04/24/2021, 12:37 PM
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
## 2021-04-24 NOTE — BHH Suicide Risk Assessment (Signed)
[facility] Discharge Suicide Risk Assessment   Principal Problem: Schizophrenia spectrum disorder with psychotic disorder type not yet determined [facility]) Discharge Diagnoses: Principal Problem:   Schizophrenia spectrum disorder with psychotic disorder type not yet determined (HCC) Active Problems:   Aggressive behavior   Cannabis abuse   Total Time spent with patient: 20 minutes  Musculoskeletal: Strength & Muscle Tone: within normal limits Gait & Station: normal Patient leans: N/A  Psychiatric Specialty Exam  Presentation  General Appearance: Appropriate for Environment; Fairly Groomed; Casual  Eye Contact:Fair  Speech:Clear and Coherent; Normal Rate  Speech Volume:Normal  Handedness:Right   Mood and Affect  Mood:Depressed  Duration of Depression Symptoms: No data recorded Affect:Flat   Thought Process  Thought Processes:Coherent; Goal Directed  Descriptions of Associations:Intact  Orientation:Full (Time, Place and Person)  Thought Content:Logical; WDL  History of Schizophrenia/Schizoaffective disorder:No  Duration of Psychotic Symptoms:Less than six months  Hallucinations:Hallucinations: None  Ideas of Reference:None  Suicidal Thoughts:Suicidal Thoughts: No  Homicidal Thoughts:Homicidal Thoughts: No   Sensorium  Memory:Immediate Fair; Recent Fair  Judgment:Fair  Insight:Poor   Executive Functions  Concentration:Fair  Attention Span:Fair  Recall:Fair  Fund of Knowledge:Fair  Language:Fair   Psychomotor Activity  Psychomotor Activity:Psychomotor Activity: Normal   Assets  Assets:Resilience; Social Support; Physical Health   Sleep  Sleep:Sleep: Good Number of Hours of Sleep: 7   Physical Exam: Physical Exam Vitals and nursing note reviewed.  Constitutional:      Appearance: Normal appearance.  HENT:     Head: Normocephalic and atraumatic.  Pulmonary:     Effort: Pulmonary effort is normal.  Neurological:     General: No  focal deficit present.     Mental Status: She is alert and oriented to person, place, and time.   Review of Systems  All other systems reviewed and are negative. Blood pressure 96/79, pulse (!) 130, temperature (!) 97.5 F (36.4 C), temperature source Oral, resp. rate 17, height 5\' 7"  (1.702 m), weight 54.4 kg, SpO2 100 %. Body mass index is 18.79 kg/m.  Mental Status Per Nursing Assessment::   On Admission:     Demographic Factors:  Unemployed  Loss Factors: NA  Historical Factors: Impulsivity  Risk Reduction Factors:   Living with another person, especially a relative and Positive social support  Continued Clinical Symptoms:  Bipolar Disorder:   Mixed State Personality Disorders:   Cluster B More than one psychiatric diagnosis Previous Psychiatric Diagnoses and Treatments  Cognitive Features That Contribute To Risk:  Thought constriction (tunnel vision)    Suicide Risk:  Minimal: No identifiable suicidal ideation.  Patients presenting with no risk factors but with morbid ruminations; may be classified as minimal risk based on the severity of the depressive symptoms   Follow-up Information     [facility] Follow up.   Why: You  have a hospital follow up appointment for therapy and medication management services on       This appointment will be held in person. Contact information: [address] [place] [phone]                 Plan Of Care/Follow-up recommendations:  Activity:  ad lib  [phone], MD 04/24/2021, 11:50 AM

## 2021-04-24 NOTE — Final Progress Note (Signed)
Discharge Note:  Patient denies SI/HI AVH at this time. Discharge instructions, AVS, prescriptions and transition record gone over with patient. Patient agrees to comply with medication management, follow-up visit, and outpatient therapy. Patient belongings returned to patient. Patient questions and concerns addressed and answered.  Patient ambulatory off unit.  Patient discharged to home.   

## 2021-04-24 NOTE — Progress Notes (Signed)
D: Patient denies SI/HI/AVH. Patient denies anxiety and depression. Pt. Was cooperative with blood work and EKG.  A:  Patient took scheduled medicine.  Support and encouragement provided Routine safety checks conducted every 15 minutes. Patient  Informed to notify staff with any concerns.   R:Safety maintained.

## 2021-04-25 LAB — GC/CHLAMYDIA PROBE AMP (~~LOC~~) NOT AT ARMC
Chlamydia: POSITIVE — AB
Comment: NEGATIVE
Comment: NORMAL
Neisseria Gonorrhea: NEGATIVE

## 2021-04-27 NOTE — Plan of Care (Signed)
Attempted to contact patient about results of her lab work. The call went straight to voicemail which was not set up. Called patients mother to attempt to leave a message to contact [facility] however there was no answer. Will attempt to call again tomorrow.    Arna Snipe MD Resident
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
## 2021-05-09 ENCOUNTER — Telehealth (HOSPITAL_COMMUNITY): Payer: Self-pay | Admitting: Student in an Organized Health Care Education/Training Program

## 2021-05-09 NOTE — Telephone Encounter (Signed)
Called patient to inform her about her positive Chlamydia test result from her admission on 8/2. Confirmed patient identity and told her about the positive result. Discussed with her the treatment would be Doxycycline twice a day for 7 days. Stressed the importance of completing the entire antibiotic course. She requested the prescription be sent to [facility] at [address] [place], [place] [address]. Discussed that should she have any side effects from the medication or signs or smptoms of infection after completing her 7 days of treatment to make an appointment with her PCP. She reported understanding and had no questions. Prescription was then called into [facility] pharmacy and will be filled.   Plan: Doxycycline 100 mg BID for 7 days    Arna Snipe MD Resident

## 2021-05-09 NOTE — Telephone Encounter (Signed)
Patient was returning call to Dr. Lynnell Chad regarding lab results. Patient can be reached at phone number [phone] for follow up.

## 2021-08-02 ENCOUNTER — Ambulatory Visit (HOSPITAL_COMMUNITY)
Admission: EM | Admit: 2021-08-02 | Discharge: 2021-08-02 | Disposition: A | Discharge status: Home / Self Care | Payer: 59 | Attending: Nurse Practitioner | Admitting: Nurse Practitioner

## 2021-08-02 ENCOUNTER — Encounter (HOSPITAL_COMMUNITY): Payer: Self-pay | Admitting: Registered Nurse

## 2021-08-02 DIAGNOSIS — Z20822 Contact with and (suspected) exposure to covid-19: Secondary | ICD-10-CM | POA: Diagnosis not present

## 2021-08-02 DIAGNOSIS — F1290 Cannabis use, unspecified, uncomplicated: Secondary | ICD-10-CM | POA: Insufficient documentation

## 2021-08-02 DIAGNOSIS — F09 Unspecified mental disorder due to known physiological condition: Secondary | ICD-10-CM | POA: Insufficient documentation

## 2021-08-02 DIAGNOSIS — R443 Hallucinations, unspecified: Secondary | ICD-10-CM | POA: Insufficient documentation

## 2021-08-02 DIAGNOSIS — R4689 Other symptoms and signs involving appearance and behavior: Secondary | ICD-10-CM | POA: Diagnosis not present

## 2021-08-02 DIAGNOSIS — Z046 Encounter for general psychiatric examination, requested by authority: Secondary | ICD-10-CM | POA: Diagnosis not present

## 2021-08-02 DIAGNOSIS — F29 Unspecified psychosis not due to a substance or known physiological condition: Secondary | ICD-10-CM

## 2021-08-02 DIAGNOSIS — R45851 Suicidal ideations: Secondary | ICD-10-CM | POA: Diagnosis present

## 2021-08-02 DIAGNOSIS — F1210 Cannabis abuse, uncomplicated: Secondary | ICD-10-CM

## 2021-08-02 DIAGNOSIS — R45850 Homicidal ideations: Secondary | ICD-10-CM | POA: Diagnosis not present

## 2021-08-02 LAB — POCT URINE DRUG SCREEN - MANUAL ENTRY (I-SCREEN)
POC Amphetamine UR: NOT DETECTED
POC Buprenorphine (BUP): NOT DETECTED
POC Cocaine UR: NOT DETECTED
POC Marijuana UR: POSITIVE — AB
POC Methadone UR: NOT DETECTED
POC Methamphetamine UR: NOT DETECTED
POC Morphine: NOT DETECTED
POC Oxazepam (BZO): NOT DETECTED
POC Oxycodone UR: NOT DETECTED
POC Secobarbital (BAR): NOT DETECTED

## 2021-08-02 LAB — COMPREHENSIVE METABOLIC PANEL
ALT: 14 U/L (ref 0–44)
AST: 23 U/L (ref 15–41)
Albumin: 4.1 g/dL (ref 3.5–5.0)
Alkaline Phosphatase: 45 U/L (ref 38–126)
Anion gap: 9 (ref 5–15)
BUN: 19 mg/dL (ref 6–20)
CO2: 24 mmol/L (ref 22–32)
Calcium: 9.3 mg/dL (ref 8.9–10.3)
Chloride: 105 mmol/L (ref 98–111)
Creatinine, Ser: 0.79 mg/dL (ref 0.44–1.00)
GFR, Estimated: >60 mL/min (ref >60)
Glucose, Bld: 103 mg/dL — ABNORMAL HIGH (ref 70–99)
Potassium: 3.5 mmol/L (ref 3.5–5.1)
Sodium: 138 mmol/L (ref 135–145)
Total Bilirubin: 0.7 mg/dL (ref 0.3–1.2)
Total Protein: 7.3 g/dL (ref 6.5–8.1)

## 2021-08-02 LAB — RESP PANEL BY RT-PCR (FLU A&B, COVID) ARPGX2
Influenza A by PCR: NEGATIVE
Influenza B by PCR: NEGATIVE
SARS Coronavirus 2 by RT PCR: NEGATIVE

## 2021-08-02 LAB — CBC WITH DIFFERENTIAL/PLATELET
Abs Immature Granulocytes: 0.03 10*3/uL (ref 0.00–0.07)
Basophils Absolute: 0 10*3/uL (ref 0.0–0.1)
Basophils Relative: 1 %
Eosinophils Absolute: 0.1 10*3/uL (ref 0.0–0.5)
Eosinophils Relative: 1 %
HCT: 34.8 % — ABNORMAL LOW (ref 36.0–46.0)
Hemoglobin: 10.8 g/dL — ABNORMAL LOW (ref 12.0–15.0)
Immature Granulocytes: 0 %
Lymphocytes Relative: 37 %
Lymphs Abs: 2.7 10*3/uL (ref 0.7–4.0)
MCH: 26.7 pg (ref 26.0–34.0)
MCHC: 31 g/dL (ref 30.0–36.0)
MCV: 86.1 fL (ref 80.0–100.0)
Monocytes Absolute: 0.7 10*3/uL (ref 0.1–1.0)
Monocytes Relative: 10 %
Neutro Abs: 3.8 10*3/uL (ref 1.7–7.7)
Neutrophils Relative %: 51 %
Platelets: 239 10*3/uL (ref 150–400)
RBC: 4.04 MIL/uL (ref 3.87–5.11)
RDW: 15.3 % (ref 11.5–15.5)
WBC: 7.4 10*3/uL (ref 4.0–10.5)
nRBC: 0 % (ref 0.0–0.2)

## 2021-08-02 LAB — ETHANOL: Alcohol, Ethyl (B): <10 mg/dL (ref <10)

## 2021-08-02 LAB — POCT PREGNANCY, URINE: Preg Test, Ur: NEGATIVE

## 2021-08-02 LAB — POC SARS CORONAVIRUS 2 AG: SARSCOV2ONAVIRUS 2 AG: NEGATIVE

## 2021-08-02 LAB — VALPROIC ACID LEVEL: Valproic Acid Lvl: <10 ug/mL — ABNORMAL LOW (ref 50.0–100.0)

## 2021-08-02 LAB — POC SARS CORONAVIRUS 2 AG -  ED: SARS Coronavirus 2 Ag: NEGATIVE

## 2021-08-02 MED ORDER — MAGNESIUM HYDROXIDE 400 MG/5ML PO SUSP: 30.0000 mL | Freq: Every day | ORAL | Status: DC | PRN

## 2021-08-02 MED ORDER — RISPERIDONE 1 MG PO TABS
1.0000 mg | ORAL_TABLET | Freq: Every day | ORAL | Status: DC
  Filled 2021-08-02: qty 1

## 2021-08-02 MED ORDER — RISPERIDONE 1 MG PO TABS: 1.0000 mg | ORAL_TABLET | Freq: Every day | ORAL | Status: DC

## 2021-08-02 MED ORDER — HYDROXYZINE HCL 25 MG PO TABS: 25.0000 mg | ORAL_TABLET | Freq: Three times a day (TID) | ORAL | Status: DC | PRN

## 2021-08-02 MED ORDER — DIVALPROEX SODIUM 250 MG PO DR TAB: 250.0000 mg | DELAYED_RELEASE_TABLET | Freq: Two times a day (BID) | ORAL | 0 refills | Status: DC

## 2021-08-02 MED ORDER — DIVALPROEX SODIUM 250 MG PO DR TAB
250.0000 mg | DELAYED_RELEASE_TABLET | Freq: Two times a day (BID) | ORAL | Status: DC
  Filled 2021-08-02: qty 1

## 2021-08-02 MED ORDER — RISPERIDONE 1 MG PO TABS: 1.0000 mg | ORAL_TABLET | Freq: Two times a day (BID) | ORAL | 0 refills | Status: DC

## 2021-08-02 MED ORDER — ALUM & MAG HYDROXIDE-SIMETH 200-200-20 MG/5ML PO SUSP: 30.0000 mL | ORAL | Status: DC | PRN

## 2021-08-02 MED ORDER — ACETAMINOPHEN 325 MG PO TABS: 650.0000 mg | ORAL_TABLET | Freq: Four times a day (QID) | ORAL | Status: DC | PRN

## 2021-08-02 MED ORDER — HYDROXYZINE HCL 25 MG PO TABS: 25.0000 mg | ORAL_TABLET | Freq: Three times a day (TID) | ORAL | 0 refills | Status: DC | PRN

## 2021-08-02 NOTE — ED Notes (Signed)
Pt discharged with  AVS.  AVS reviewed prior to discharge.  Pt alert, oriented, and ambulatory.  Safety maintained.  °

## 2021-08-02 NOTE — ED Provider Notes (Signed)
Behavioral Health Admission [facility] OBS)  Date: 08/02/21 Patient Name: Monica Richardson MRN: [id number] Chief Complaint:  Chief Complaint  Patient presents with  . Suicidal      Diagnoses:  Final diagnoses:  Schizophrenia spectrum disorder with psychotic disorder type not yet determined (HCC)  Involuntary commitment    HPI: Monica Richardson is a 20 y.o. with a history of schizophrenia spectrum disorder who presents to [facility] under involuntary commitment. Patient was petitioned for IVC by her mother, Latechia Sperduto [phone]. Provider attempted contacting the patient's mother twice to no avail. Per IVC "Respondent has been committed in August of this year and once released she has not taken her medication.  Respondent was found laying in the front yard of her home and has repeated that she would hurt herself.  Holding a knife in her hand.  Respondent has stated that she was going to kill her mother and is hallucinating and responding to the voices she admits she is hearing." First exam completed by Melbourne Abts, PA-C.    Patient states that two days ago she went to her mother's house who was out of town. She states that she was lying on the porch and neighbor called the police. She states that a Conservator, museum/gallery used the "electronic doorbell" to contact her mother. She states that her mother then unlocked the doors remotely and gave her permission to stay there. Patient states that her mother returned home today. She states that she was sleeping when the deputies notified her that she had been involuntary committed. Patient adamantly denies that she is suicidal or homicidal. She denies auditory and visual hallucinations. No indication that she is responding to internal stimuli. She denies paranoia. No delusion elicited during this assessment.   Patient initially denies a psychiatric history. When questioned regarding her inpatient admission at [facility], patient states "that was just a big misunderstanding."   Patient states that she has never been prescribed psychiatric medications.   She was inpatient at [facility] 04/12/2021-04/24/2021. She was discharged on depakote 500 mg BID, Risperdal 2 mg daily, and Risperdal 3 mg QHS. In March 2021, the patient was petitioned for IVC due to aggressive behavior towards her mother.  On evaluation patient is alert and oriented x 4. She is initially calm and cooperative. She becomes irritated when discussing IVC. Speech is clear and coherent. Mood is irritabile and affect is congruent with mood. Thought process is coherent and thought content is logical. Denies audiovisual hallucinations. No indication that patient is responding to internal stimuli. No evidence of delusional thought content. Denies suicidal ideations. Denies homicidal ideations. Denies use of alcohol, marijuana, and other substances. UDS positive for marijuana.   PHQ 2-9:   Flowsheet Row Ad[facility] (Dis[facility]) [facility] from 04/11/2021 in [facility] ED from 01/14/2021 in [facility] EMERGENCY DEPARTMENT  C-SSRS RISK CATEGORY No Risk No Risk No Risk        Total Time spent with patient: 45 minutes  Musculoskeletal  Strength & Muscle Tone: within normal limits Gait & Station: normal Patient leans: N/A  Psychiatric Specialty Exam  Presentation General Appearance: Appropriate for Environment; Well Groomed  Eye Contact:Good  Speech:Clear and Coherent; Normal Rate  Speech Volume:Normal  Handedness:Right   Mood and Affect  Mood:Angry; Irritable  Affect:Congruent   Thought Process  Thought Processes:Coherent; Linear  Descriptions of Associations:Intact  Orientation:Full (Time, Place and Person)  Thought Content:Logical  Diagnosis of Schizophrenia or Schizoaffective  disorder in past: No  Duration of Psychotic Symptoms: Less than six months  Hallucinations:Hallucinations:  None  Ideas of Reference:None  Suicidal Thoughts:Suicidal Thoughts: No  Homicidal Thoughts:Homicidal Thoughts: No   Sensorium  Memory:Immediate Good; Recent Good; Remote Fair  Judgment:Fair  Insight:Present   Executive Functions  Concentration:Fair  Attention Span:Fair  Recall:Good  Fund of Knowledge:Good  Language:Good   Psychomotor Activity  Psychomotor Activity:Psychomotor Activity: Normal   Assets  Assets:Financial Resources/Insurance; Physical Health   Sleep  Sleep:Sleep: Good   Nutritional Assessment (For OBS and FBC admissions only) Has the patient had a weight loss or gain of 10 pounds or more in the last 3 months?: No Has the patient had a decrease in food intake/or appetite?: No Does the patient have dental problems?: No Does the patient have eating habits or behaviors that may be indicators of an eating disorder including binging or inducing vomiting?: No Has the patient recently lost weight without trying?: 0 Has the patient been eating poorly because of a decreased appetite?: 0 Malnutrition Screening Tool Score: 0   Physical Exam Constitutional:      General: She is not in acute distress.    Appearance: She is not ill-appearing, toxic-appearing or diaphoretic.  HENT:     Head: Normocephalic.     Right Ear: External ear normal.     Left Ear: External ear normal.  Eyes:     Conjunctiva/sclera: Conjunctivae normal.     Pupils: Pupils are equal, round, and reactive to light.  Cardiovascular:     Rate and Rhythm: Normal rate.  Pulmonary:     Effort: Pulmonary effort is normal. No respiratory distress.  Musculoskeletal:        General: Normal range of motion.  Skin:    General: Skin is warm and dry.  Neurological:     Mental Status: She is alert and oriented to person, place, and time.  Psychiatric:        Thought Content: Thought content is not paranoid or delusional. Thought content does not include homicidal or suicidal ideation.    Review of Systems  Constitutional:  Negative for chills, diaphoresis, fever, malaise/fatigue and weight loss.  HENT:  Negative for congestion.   Respiratory:  Negative for cough and shortness of breath.   Cardiovascular:  Negative for chest pain and palpitations.  Gastrointestinal:  Negative for diarrhea, nausea and vomiting.  Neurological:  Negative for dizziness and seizures.  Psychiatric/Behavioral:  Negative for depression, hallucinations, memory loss, substance abuse and suicidal ideas. The patient is not nervous/anxious and does not have insomnia.   All other systems reviewed and are negative.  Blood pressure 128/64, pulse 69, temperature 98 F (36.7 C), temperature source Tympanic, SpO2 93 %. There is no height or weight on file to calculate BMI.  Past Psychiatric History: schizophrenia spectrum disorder. Inpatient at [facility] 04/12/2021-04/24/2021. She was discharged on depakote 500 mg BID, Risperdal 2 mg daily, and Risperdal 3 mg QHS.   Is the patient at risk to self? No  Has the patient been a risk to self in the past 6 months? No .    Has the patient been a risk to self within the distant past? No   Is the patient a risk to others? Yes   Has the patient been a risk to others in the past 6 months? Yes   Has the patient been a risk to others within the distant past? Yes   Past Medical History: No past medical history on file. No past  surgical history on file.  Family History: No family history on file.  Social History:  Social History   Socioeconomic History  . Marital status: Single    Spouse name: Not on file  . Number of children: Not on file  . Years of education: Not on file  . Highest education level: Not on file  Occupational History  . Not on file  Tobacco Use  . Smoking status: Never  . Smokeless tobacco: Never  Substance and Sexual Activity  . Alcohol use: No  . Drug use: Yes    Types: Marijuana  . Sexual activity: Not on file  Other Topics Concern   . Not on file  Social History Narrative  . Not on file   Social Determinants of Health   Financial Resource Strain: Not on file  Food Insecurity: Not on file  Transportation Needs: Not on file  Physical Activity: Not on file  Stress: Not on file  Social Connections: Not on file  Intimate Partner Violence: Not on file    SDOH:  SDOH Screenings   Alcohol Screen: Low Risk   . Last Alcohol Screening Score (AUDIT): 0  Depression (PHQ2-9): Not on file  Financial Resource Strain: Not on file  Food Insecurity: Not on file  Housing: Not on file  Physical Activity: Not on file  Social Connections: Not on file  Stress: Not on file  Tobacco Use: Low Risk   . Smoking Tobacco Use: Never  . Smokeless Tobacco Use: Never  . Passive Exposure: Not on file  Transportation Needs: Not on file    Last Labs:  Admission on 04/12/2021, Discharged on 04/24/2021  Component Date Value Ref Range Status  . Hgb A1c MFr Bld 04/12/2021 5.4  4.8 - 5.6 % Final   Comment: (NOTE) Pre diabetes:          5.7%-6.4%  Diabetes:              >6.4%  Glycemic control for   <7.0% adults with diabetes   . Mean Plasma Glucose 04/12/2021 108.28  mg/dL Final   Performed at [facility], [address] [place], [place] [address]  . Cholesterol 04/12/2021 201 (H)  0 - 200 mg/dL Final  . Triglycerides 04/12/2021 58  <150 mg/dL Final  . HDL 61/44/3154 51  >40 mg/dL Final  . Total CHOL/HDL Ratio 04/12/2021 3.9  RATIO Final  . VLDL 04/12/2021 12  0 - 40 mg/dL Final  . LDL Cholesterol 04/12/2021 138 (H)  0 - 99 mg/dL Final   Comment:        Total Cholesterol/HDL:CHD Risk Coronary Heart Disease Risk Table                     Men   Women  1/2 Average Risk   3.4   3.3  Average Risk       5.0   4.4  2 X Average Risk   9.6   7.1  3 X Average Risk  23.4   11.0        Use the calculated Patient Ratio above and the CHD Risk Table to determine the patient's CHD Risk.        ATP III CLASSIFICATION (LDL):   <100     mg/dL   Optimal  008-676  mg/dL   Near or Above                    Optimal  130-159  mg/dL  Borderline  160-189  mg/dL   High  >025     mg/dL   Very High Performed at [facility], [address] [place], [place] [address]   . TSH 04/12/2021 1.988  0.350 - 4.500 uIU/mL Final   Comment: Performed by a 3rd Generation assay with a functional sensitivity of <=0.01 uIU/mL. Performed at [facility], [address] [place], [place] [address]   . Anti Nuclear Antibody (ANA) 04/13/2021 Negative  Negative Final   Comment: (NOTE) Performed At: [facility] [address] [place], [place] [id number] Jolene Schimke MD [id number]   . Ceruloplasmin 04/13/2021 19.9  19.0 - 39.0 mg/dL Final   Comment: (NOTE) Performed At: [facility] [address] [place], [place] [id number] Jolene Schimke MD [id number]   . RPR Ser Ql 04/13/2021 NON REACTIVE  NON REACTIVE Final   Performed at [facility], [address] [place], [place] [address]  . Sed Rate 04/13/2021 6  0 - 22 mm/hr Final   Performed at [facility], [address], [place], [place] [address]  . Vitamin B-12 04/13/2021 329  180 - 914 pg/mL Final   Comment: (NOTE) This assay is not validated for testing neonatal or myeloproliferative syndrome specimens for Vitamin B12 levels. Performed at [facility], [address], [place], [place] [address]   . Sodium 04/13/2021 139  135 - 145 mmol/L Final  . Potassium 04/13/2021 4.6  3.5 - 5.1 mmol/L Final  . Chloride 04/13/2021 105  98 - 111 mmol/L Final  . CO2 04/13/2021 24  22 - 32 mmol/L Final  . Glucose, Bld 04/13/2021 130 (H)  70 - 99 mg/dL Final   Glucose reference range applies only to samples taken after fasting for at least 8 hours.  . BUN 04/13/2021 16  6 - 20 mg/dL Final  . Creatinine, Ser 04/13/2021 0.87  0.44 - 1.00 mg/dL Final  . Calcium 97/35/3299 10.2  8.9 - 10.3  mg/dL Final  . Total Protein 04/13/2021 8.1  6.5 - 8.1 g/dL Final  . Albumin 24/26/8341 5.0  3.5 - 5.0 g/dL Final  . AST 96/22/2979 23  15 - 41 U/L Final  . ALT 04/13/2021 13  0 - 44 U/L Final  . Alkaline Phosphatase 04/13/2021 55  38 - 126 U/L Final  . Total Bilirubin 04/13/2021 0.6  0.3 - 1.2 mg/dL Final  . GFR, Estimated 04/13/2021 >60  >60 mL/min Final   Comment: (NOTE) Calculated using the CKD-EPI Creatinine Equation (2021)   . Anion gap 04/13/2021 10  5 - 15 Final   Performed at [facility], [address] [place], [place] [address]  . SARS Coronavirus 2 by RT PCR 04/19/2021 NEGATIVE  NEGATIVE Final   Comment: (NOTE) SARS-CoV-2 target nucleic acids are NOT DETECTED.  The SARS-CoV-2 RNA is generally detectable in upper respiratory specimens during the acute phase of infection. The lowest concentration of SARS-CoV-2 viral copies this assay can detect is 138 copies/mL. A negative result does not preclude SARS-Cov-2 infection and should not be used as the sole basis for treatment or other patient management decisions. A negative result may occur with  improper specimen collection/handling, submission of specimen other than nasopharyngeal swab, presence of viral mutation(s) within the areas targeted by this assay, and inadequate number of viral copies(<138 copies/mL). A negative result must be combined with clinical observations, patient history, and epidemiological information. The expected result is Negative.  Fact Sheet for Patients:  BloggerCourse.com  Fact Sheet for Healthcare Providers:  SeriousBroker.it  This test is no                          t yet approved or cleared by the [place] FDA and  has been authorized for detection and/or diagnosis of SARS-CoV-2 by FDA under an Emergency Use Authorization (EUA). This EUA will remain  in effect (meaning this test can be used) for the duration of  the COVID-19 declaration under Section 564(b)(1) of the Act, 21 U.S.C.section 360bbb-3(b)(1), unless the authorization is terminated  or revoked sooner.      . Influenza A by PCR 04/19/2021 NEGATIVE  NEGATIVE Final  . Influenza B by PCR 04/19/2021 NEGATIVE  NEGATIVE Final   Comment: (NOTE) The Xpert Xpress SARS-CoV-2/FLU/RSV plus assay is intended as an aid in the diagnosis of influenza from Nasopharyngeal swab specimens and should not be used as a sole basis for treatment. Nasal washings and aspirates are unacceptable for Xpert Xpress SARS-CoV-2/FLU/RSV testing.  Fact Sheet for Patients: BloggerCourse.com  Fact Sheet for Healthcare Providers: SeriousBroker.it  This test is not yet approved or cleared by the [place] FDA and has been authorized for detection and/or diagnosis of SARS-CoV-2 by FDA under an Emergency Use Authorization (EUA). This EUA will remain in effect (meaning this test can be used) for the duration of the COVID-19 declaration under Section 564(b)(1) of the Act, 21 U.S.C. section 360bbb-3(b)(1), unless the authorization is terminated or revoked.  Performed at [facility], [address], [place], [place] [address]   . Color, Urine 04/22/2021 STRAW (A)  YELLOW Final  . APPearance 04/22/2021 CLEAR  CLEAR Final  . Specific Gravity, Urine 04/22/2021 1.012  1.005 - 1.030 Final  . pH 04/22/2021 7.0  5.0 - 8.0 Final  . Glucose, UA 04/22/2021 NEGATIVE  NEGATIVE mg/dL Final  . Hgb urine dipstick 04/22/2021 NEGATIVE  NEGATIVE Final  . Bilirubin Urine 04/22/2021 NEGATIVE  NEGATIVE Final  . Ketones, ur 04/22/2021 5 (A)  NEGATIVE mg/dL Final  . Protein, ur 60/06/9322 NEGATIVE  NEGATIVE mg/dL Final  . Nitrite 55/73/2202 NEGATIVE  NEGATIVE Final  . Glori Luis 04/22/2021 TRACE (A)  NEGATIVE Final  . RBC / HPF 04/22/2021 0-5  0 - 5 RBC/hpf Final  . WBC, UA 04/22/2021 0-5  0 - 5 WBC/hpf Final  .  Bacteria, UA 04/22/2021 NONE SEEN  NONE SEEN Final  . Squamous Epithelial / LPF 04/22/2021 0-5  0 - 5 Final  . Mucus 04/22/2021 PRESENT   Final   Performed at [facility], [address] [place], [place] [address]  . Chlamydia 04/21/2021 Positive (A)   Final  . Neisseria Gonorrhea 04/21/2021 Negative   Final  . Comment 04/21/2021 Normal Reference Ranger Chlamydia - Negative   Final  . Comment 04/21/2021 Normal Reference Range Neisseria Gonorrhea - Negative   Final  . Valproic Acid Lvl 04/22/2021 57  50.0 - 100.0 ug/mL Final   Performed at [facility], [address], [place], [place] [address]  . WBC 04/24/2021 4.2  4.0 - 10.5 K/uL Final  . RBC 04/24/2021 4.31  3.87 - 5.11 MIL/uL Final  . Hemoglobin 04/24/2021 11.9 (L)  12.0 - 15.0 g/dL Final  . HCT 28/31/5176 37.1  36.0 - 46.0 % Final  . MCV 04/24/2021 86.1  80.0 - 100.0 fL Final  . MCH 04/24/2021 27.6  26.0 - 34.0 pg Final  . MCHC 04/24/2021 32.1  30.0 - 36.0 g/dL Final  . RDW 29/56/2130 15.9 (H)  11.5 - 15.5 % Final  . Platelets 04/24/2021 183  150 - 400 K/uL Final  . nRBC 04/24/2021 0.0  0.0 - 0.2 % Final  . Neutrophils Relative % 04/24/2021 45  % Final  . Neutro Abs 04/24/2021 2.0  1.7 - 7.7 K/uL Final  . Lymphocytes Relative 04/24/2021 42  % Final  . Lymphs Abs 04/24/2021 1.8  0.7 - 4.0 K/uL Final  . Monocytes Relative 04/24/2021 10  % Final  . Monocytes Absolute 04/24/2021 0.4  0.1 - 1.0 K/uL Final  . Eosinophils Relative 04/24/2021 2  % Final  . Eosinophils Absolute 04/24/2021 0.1  0.0 - 0.5 K/uL Final  . Basophils Relative 04/24/2021 1  % Final  . Basophils Absolute 04/24/2021 0.0  0.0 - 0.1 K/uL Final  . Immature Granulocytes 04/24/2021 0  % Final  . Abs Immature Granulocytes 04/24/2021 0.01  0.00 - 0.07 K/uL Final   Performed at [facility], [address], [place], [place] [address]  . Sodium 04/24/2021 141  135 - 145 mmol/L Final  . Potassium 04/24/2021 4.0   3.5 - 5.1 mmol/L Final  . Chloride 04/24/2021 106  98 - 111 mmol/L Final  . CO2 04/24/2021 25  22 - 32 mmol/L Final  . Glucose, Bld 04/24/2021 112 (H)  70 - 99 mg/dL Final   Glucose reference range applies only to samples taken after fasting for at least 8 hours.  . BUN 04/24/2021 11  6 - 20 mg/dL Final  . Creatinine, Ser 04/24/2021 0.87  0.44 - 1.00 mg/dL Final  . Calcium 46/96/2952 9.5  8.9 - 10.3 mg/dL Final  . Total Protein 04/24/2021 7.3  6.5 - 8.1 g/dL Final  . Albumin 84/13/2440 4.3  3.5 - 5.0 g/dL Final  . AST 07/08/2535 25  15 - 41 U/L Final  . ALT 04/24/2021 14  0 - 44 U/L Final  . Alkaline Phosphatase 04/24/2021 48  38 - 126 U/L Final  . Total Bilirubin 04/24/2021 0.6  0.3 - 1.2 mg/dL Final  . GFR, Estimated 04/24/2021 >60  >60 mL/min Final   Comment: (NOTE) Calculated using the CKD-EPI Creatinine Equation (2021)   . Anion gap 04/24/2021 10  5 - 15 Final   Performed at [facility], [address] [place], [place] [address]  . Valproic Acid Lvl 04/24/2021 100  50.0 - 100.0 ug/mL Final   Performed at [facility], [address] [place], [place] [address]  Admission on 04/12/2021, Discharged on 04/12/2021  Component Date Value Ref Range Status  . Preg Test, Ur 04/12/2021 NEGATIVE  NEGATIVE Final   Comment:        THE SENSITIVITY OF THIS METHODOLOGY IS >20 mIU/mL. Performed at [facility] [address], [place], [place] [address]   . Opiates 04/12/2021 NONE DETECTED  NONE DETECTED Final  . Cocaine 04/12/2021 NONE DETECTED  NONE DETECTED Final  . Benzodiazepines 04/12/2021 NONE DETECTED  NONE DETECTED Final  . Amphetamines 04/12/2021 NONE DETECTED  NONE DETECTED Final  . Tetrahydrocannabinol 04/12/2021 POSITIVE (A)  NONE DETECTED Final  . Barbiturates 04/12/2021 NONE DETECTED  NONE DETECTED Final   Comment: (NOTE) DRUG SCREEN FOR MEDICAL PURPOSES ONLY.  IF CONFIRMATION IS NEEDED FOR ANY PURPOSE, NOTIFY  LAB WITHIN 5 DAYS.  LOWEST DETECTABLE LIMITS FOR URINE DRUG SCREEN Drug Class  Cutoff (ng/mL) Amphetamine and metabolites    1000 Barbiturate and metabolites    200 Benzodiazepine                 200 Tricyclics and metabolites     300 Opiates and metabolites        300 Cocaine and metabolites        300 THC                            50 Performed at [facility], [address] [place] [address] [place], [place] [address]   Admission on 04/11/2021, Discharged on 04/12/2021  Component Date Value Ref Range Status  . SARS Coronavirus 2 Ag 04/12/2021 Negative  Negative Preliminary  . WBC 04/12/2021 6.2  4.0 - 10.5 K/uL Final  . RBC 04/12/2021 4.24  3.87 - 5.11 MIL/uL Final  . Hemoglobin 04/12/2021 11.6 (L)  12.0 - 15.0 g/dL Final  . HCT 60/45/4098 36.4  36.0 - 46.0 % Final  . MCV 04/12/2021 85.8  80.0 - 100.0 fL Final  . MCH 04/12/2021 27.4  26.0 - 34.0 pg Final  . MCHC 04/12/2021 31.9  30.0 - 36.0 g/dL Final  . RDW 11/91/4782 16.3 (H)  11.5 - 15.5 % Final  . Platelets 04/12/2021 193  150 - 400 K/uL Final  . nRBC 04/12/2021 0.0  0.0 - 0.2 % Final   Performed at [facility], [address] [place], [place] [address]  . Sodium 04/12/2021 136  135 - 145 mmol/L Final  . Potassium 04/12/2021 3.0 (L)  3.5 - 5.1 mmol/L Final  . Chloride 04/12/2021 105  98 - 111 mmol/L Final  . CO2 04/12/2021 22  22 - 32 mmol/L Final  . Glucose, Bld 04/12/2021 83  70 - 99 mg/dL Final   Glucose reference range applies only to samples taken after fasting for at least 8 hours.  . BUN 04/12/2021 10  6 - 20 mg/dL Final  . Creatinine, Ser 04/12/2021 0.87  0.44 - 1.00 mg/dL Final  . Calcium 30/86/5784 9.6  8.9 - 10.3 mg/dL Final  . Total Protein 04/12/2021 7.7  6.5 - 8.1 g/dL Final  . Albumin 69/62/9528 4.6  3.5 - 5.0 g/dL Final  . AST 41/32/4401 26  15 - 41 U/L Final  . ALT 04/12/2021 15  0 - 44 U/L Final  . Alkaline Phosphatase 04/12/2021 48  38 - 126 U/L Final  . Total  Bilirubin 04/12/2021 0.8  0.3 - 1.2 mg/dL Final  . GFR, Estimated 04/12/2021 >60  >60 mL/min Final   Comment: (NOTE) Calculated using the CKD-EPI Creatinine Equation (2021)   . Anion gap 04/12/2021 9  5 - 15 Final   Performed at [facility], [address], [place], [place] [address]  . SARS Coronavirus 2 by RT PCR 04/12/2021 NEGATIVE  NEGATIVE Final   Comment: (NOTE) SARS-CoV-2 target nucleic acids are NOT DETECTED.  The SARS-CoV-2 RNA is generally detectable in upper respiratory specimens during the acute phase of infection. The lowest concentration of SARS-CoV-2 viral copies this assay can detect is 138 copies/mL. A negative result does not preclude SARS-Cov-2 infection and should not be used as the sole basis for treatment or other patient management decisions. A negative result may occur with  improper specimen collection/handling, submission of specimen other than nasopharyngeal swab, presence of viral mutation(s) within the areas targeted by this assay, and inadequate number of viral copies(<138 copies/mL). A  negative result must be combined with clinical observations, patient history, and epidemiological information. The expected result is Negative.  Fact Sheet for Patients:  BloggerCourse.com  Fact Sheet for Healthcare Providers:  SeriousBroker.it  This test is no                          t yet approved or cleared by the [place] FDA and  has been authorized for detection and/or diagnosis of SARS-CoV-2 by FDA under an Emergency Use Authorization (EUA). This EUA will remain  in effect (meaning this test can be used) for the duration of the COVID-19 declaration under Section 564(b)(1) of the Act, 21 U.S.C.section 360bbb-3(b)(1), unless the authorization is terminated  or revoked sooner.      . Influenza A by PCR 04/12/2021 NEGATIVE  NEGATIVE Final  . Influenza B by PCR 04/12/2021 NEGATIVE  NEGATIVE Final    Comment: (NOTE) The Xpert Xpress SARS-CoV-2/FLU/RSV plus assay is intended as an aid in the diagnosis of influenza from Nasopharyngeal swab specimens and should not be used as a sole basis for treatment. Nasal washings and aspirates are unacceptable for Xpert Xpress SARS-CoV-2/FLU/RSV testing.  Fact Sheet for Patients: BloggerCourse.com  Fact Sheet for Healthcare Providers: SeriousBroker.it  This test is not yet approved or cleared by the [place] FDA and has been authorized for detection and/or diagnosis of SARS-CoV-2 by FDA under an Emergency Use Authorization (EUA). This EUA will remain in effect (meaning this test can be used) for the duration of the COVID-19 declaration under Section 564(b)(1) of the Act, 21 U.S.C. section 360bbb-3(b)(1), unless the authorization is terminated or revoked.  Performed at [facility], [address] [address], [place], [place] [address]     Allergies: Patient has no known allergies.  PTA Medications:  Current Outpatient Medications  Medication Instructions  . divalproex (DEPAKOTE) 500 mg, Oral, 2 times daily  . risperiDONE (RISPERDAL) 2 mg, Oral, Daily  . risperiDONE (RISPERDAL) 3 mg, Oral, Daily at bedtime      Medical Decision Making  Patient will be placed in the continuous assessment area at [facility] for treatment and stabilization.  First Exam completed by Melbourne Abts, PA-C  Restart Depakote at 250 mg BID for mood stability Restart Risperdal 1 mg BID for mood stability/schizophreniform  Lab Orders         Resp Panel by RT-PCR (Flu A&B, Covid) Nasopharyngeal Swab         CBC with Differential/Platelet         Comprehensive metabolic panel         Ethanol         Pregnancy, urine         Valproic acid level         POC SARS Coronavirus 2 Ag-ED - Nasal Swab         POCT Urine Drug Screen - (ICup)      Clinical Course as of 08/02/21 0512  Tue Aug 02, 2021  0511 POCT Urine Drug  Screen - ([facility])(!) UDS positive for marijuana [JB]  0512 Preg Test, Ur: NEGATIVE [JB]    Clinical Course User Index [JB] Jackelyn Poling, NP    Recommendations  Based on my evaluation the patient does not appear to have an emergency medical condition.  Jackelyn Poling, NP 08/02/21  2:24 AM

## 2021-08-02 NOTE — ED Notes (Signed)
Patient presented to facility with [facility], her mother had called the Psychologist, occupational with involuntary commitment papers.Patient was cooperative when in the back sallyport but became somewhat agitated  when we took he to the exam room. She did cooperate with blood draw and lab work. She is resting quietly in bed with eyes closed.. no complaints of pain or discomfort. Marland Kitchen Respirations even and unlabored.

## 2021-08-02 NOTE — ED Notes (Signed)
Lunch given.

## 2021-08-02 NOTE — Progress Notes (Signed)
   08/02/21 0139  [facility] Triage Screening (Walk-ins at [facility] only)  How Did You Hear About [place]? Legal System  What Is the Reason for Your Visit/Call Today? Patient was IVC by her mother and brought in by [employer] Has This Been Causing You Problems?  (pt denied)  Have You Recently Had Any Thoughts About Hurting Yourself? No  Are You Planning to Commit Suicide/Harm Yourself At This time? No  Have you Recently Had Thoughts About Hurting Someone [name]? No  Are You Planning To Harm Someone At This Time? No  Are you currently experiencing any auditory, visual or other hallucinations? No  Have You Used Any Alcohol or Drugs in the Past 24 Hours? No  Clinician description of patient physical appearance/behavior: Patient presented with flat affect . She presented with a hat on her head. Patient denied being SI/Hi. Pt denied AVH hallucinations.  What Do You Feel Would Help You the Most Today?  (pt denied)  Determination of Need Urgent (48 hours)  Options For Referral Inpatient Hospitalization

## 2021-08-02 NOTE — BH Assessment (Signed)
Comprehensive Clinical Assessment (CCA) Note  08/02/2021 Monica Richardson [id number]  Disposition: Monica Conn, NP, recommends overnight observation for safety and stabilization with psych reassessment in the AM. TTS clinician attempted to complete collateral contact with mother, [name], [phone], no answer, will attempt at later time.   The patient demonstrates the following risk factors for suicide: Chronic risk factors for suicide include: psychiatric disorder of psychosis . Acute risk factors for suicide include: family or marital conflict. Protective factors for this patient include: positive social support, coping skills, and hope for the future. Considering these factors, the overall suicide risk at this point appears to be high. Patient is not appropriate for outpatient follow up.  Flowsheet Row ED from 08/02/2021 in [facility] Admission (Discharged) from 04/12/2021 in [facility] INPATIENT ADULT [facility]B ED from 04/11/2021 in [facility]  C-SSRS RISK CATEGORY No Risk No Risk No Risk      Chief Complaint:  Chief Complaint  Patient presents with   Suicidal   Monica Richardson is a 20 y.o. with a history of schizophrenia spectrum disorder who presents to [facility] under involuntary commitment. Patient was petitioned for IVC by her mother, Monica Richardson [phone]. TTS clinician attempted collateral contact 2x, no answer. TTS clinician will attempt at later time. Patient denied SI, HI, psychosis and alcohol/drug usage. Patient denied all allegations on IVC paperwork. See below. Patient reported that she was sleeping when police picked her up from her mothers house, which her mother gave her permission to stay at her house. Patient denied prior suicide attempts and self-harming behaviors. Patient denied receiving any outpatient mental health services and being prescribed any psych medications.   PER IVC: Respondent has  been committed in August of this year and once released she has not taken her medication. Respondent was found laying in the front yard of her home and has repeated that she would her herself. While holding a knife in her hand. Respondent has stated that she was going to kill her mother and is hallucinating and responding to the voices she admits she is hearing.   Per Monica Conn, NP note 08/02/21 Patient initially denies a psychiatric history. When questioned regarding her inpatient admission at [facility], patient states "that was just a big misunderstanding."  Patient states that she has never been prescribed psychiatric medications. She was inpatient at [facility] 04/12/2021-04/24/2021. She was discharged on depakote 500 mg BID, Risperdal 2 mg daily, and Risperdal 3 mg QHS. In March 2021, the patient was petitioned for IVC due to aggressive behavior towards her mother.  Visit Diagnosis:  Hx Schizophrenia spectrum disorder with psychotic disorder type not yet determined (HCC)  CCA Screening, Triage and Referral (STR)  Patient Reported Information How did you hear about [place]? Legal System  What Is the Reason for Your Visit/Call Today? Patient was IVC by her mother and brought in by [employer] Has This Been Causing You Problems? -- (pt denied)  What Do You Feel Would Help You the Most Today? -- (pt denied)   Have You Recently Had Any Thoughts About Hurting Yourself? No  Are You Planning to Commit Suicide/Harm Yourself At This time? No   Have you Recently Had Thoughts About Hurting Someone Monica Richardson? No  Are You Planning to Harm Someone at This Time? No  Explanation: No data recorded  Have You Used Any Alcohol or Drugs in the Past 24 Hours? No  How Long Ago Did You Use Drugs or  Alcohol? No data recorded What Did You Use and How Much? No data recorded  Do You Currently Have a Therapist/Psychiatrist? No  Name of Therapist/Psychiatrist: No data recorded  Have You Been Recently  Discharged From Any Office Practice or Programs? No  Explanation of Discharge From Practice/Program: No data recorded  CCA Biopsychosocial Patient Reported Schizophrenia/Schizoaffective Diagnosis in Past: No   Strengths: Pt was able to identify when she believed she was in need of assistance and asked to go to the PD and then the magistrate.   Mental Health Symptoms Depression:   -- (Pt denies)   Duration of Depressive symptoms:  Duration of Depressive Symptoms: N/A   Mania:   -- (Pt denies)   Anxiety:    Worrying; Restlessness   Psychosis:   None (denied)   Duration of Psychotic symptoms:    Trauma:   None   Obsessions:   None   Compulsions:   None   Inattention:   None   Hyperactivity/Impulsivity:   None   Oppositional/Defiant Behaviors:   None   Emotional Irregularity:   None   Other Mood/Personality Symptoms:   None noted    Mental Status Exam Appearance and self-care  Stature:   Tall   Weight:   Thin   Clothing:   Age-appropriate   Grooming:   Normal   Cosmetic use:   Age appropriate   Posture/gait:   Normal   Motor activity:   Restless   Sensorium  Attention:   Normal   Concentration:   Normal   Orientation:   X5   Recall/memory:   -- (UTA - pt is not forthcoming with information)   Affect and Mood  Affect:   Blunted; Flat   Mood:   Anxious   Relating  Eye contact:   Normal   Facial expression:   Constricted   Attitude toward examiner:   Guarded   Thought and Language  Speech flow:  Clear and Coherent   Thought content:   Appropriate to Mood and Circumstances   Preoccupation:   -- (Pt denies; pt is currently paranoid)   Hallucinations:   -- (Pt denies; pt's cousin shares pt has been laughing inappropriately at nothing.)   Organization:  No data recorded  [employer] of Knowledge:   Average   Intelligence:   Average   Abstraction:   -- (UTA)   Judgement:   Impaired    Reality Testing:   Distorted   Insight:   Poor   Decision Making:   Impulsive   Social Functioning  Social Maturity:   Impulsive   Social Judgement:   Naive   Stress  Stressors:   Family conflict; Housing   Coping Ability:   Overwhelmed; Deficient supports   Skill Deficits:   Decision making; Self-control   Supports:   Family     Religion: Religion/Spirituality Are You A Religious Person?:  (Not assessed) How Might This Affect Treatment?: Not assessed  Leisure/Recreation: Leisure / Recreation Do You Have Hobbies?: Yes Leisure and Hobbies: cooking  Exercise/Diet: Exercise/Diet Do You Exercise?:  (Not assessed) Have You Gained or Lost A Significant Amount of Weight in the Past Six Months?:  (Not assessed) Do You Follow a Special Diet?:  (Not assessed) Do You Have Any Trouble Sleeping?: No   CCA Employment/Education Employment/Work Situation: Employment / Work Situation Employment Situation: Unemployed Patient's Job has Been Impacted by Current Illness: No Has Patient ever Been in Equities trader?: No  Education: Education Is Patient Currently Attending  School?: No Last Grade Completed: 12 (Some college) Did You Attend College?: Yes Did You Have An Individualized Education Program (IIEP):  (Not assessed) Did You Have Any Difficulty At School?:  (Not assessed)   CCA Family/Childhood History Family and Relationship History: Family history Does patient have children?: No  Childhood History:  Childhood History By whom was/is the patient raised?: Other (Comment) Midwife) Did patient suffer any verbal/emotional/physical/sexual abuse as a child?: No Has patient ever been sexually abused/assaulted/raped as an adolescent or adult?: No Witnessed domestic violence?: No Has patient been affected by domestic violence as an adult?: No  Child/Adolescent Assessment:     CCA Substance Use Alcohol/Drug Use: Alcohol / Drug Use Pain Medications: See  MAR Prescriptions: See MAR Over the Counter: See MAR History of alcohol / drug use?: No history of alcohol / drug abuse Longest period of sobriety (when/how long): N/A Negative Consequences of Use:  (N/A) Withdrawal Symptoms:  (N/A)                         ASAM's:  Six Dimensions of Multidimensional Assessment  Dimension 1:  Acute Intoxication and/or Withdrawal Potential:      Dimension 2:  Biomedical Conditions and Complications:      Dimension 3:  Emotional, Behavioral, or Cognitive Conditions and Complications:     Dimension 4:  Readiness to Change:     Dimension 5:  Relapse, Continued use, or Continued Problem Potential:     Dimension 6:  Recovery/Living Environment:     ASAM Severity Score:    ASAM Recommended Level of Treatment: ASAM Recommended Level of Treatment:  (N/A)   Substance use Disorder (SUD) Substance Use Disorder (SUD)  Checklist Symptoms of Substance Use:  (N/A)  Recommendations for Services/Supports/Treatments: Recommendations for Services/Supports/Treatments Recommendations For Services/Supports/Treatments: Inpatient Hospitalization, Individual Therapy, Medication Management  Discharge Disposition:    DSM5 Diagnoses: Patient Active Problem List   Diagnosis Date Noted   Cannabis abuse 04/13/2021   Schizophrenia spectrum disorder with psychotic disorder type not yet determined (HCC) 04/12/2021   Aggressive behavior      Referrals to Alternative Service(s): Referred to Alternative Service(s):   Place:   Date:   Time:    Referred to Alternative Service(s):   Place:   Date:   Time:    Referred to Alternative Service(s):   Place:   Date:   Time:    Referred to Alternative Service(s):   Place:   Date:   Time:     Burnetta Sabin, [facility]

## 2021-08-02 NOTE — ED Notes (Signed)
Pt alert and oriented x4.  No pain or discomfort noted or reported.  Denies AVH, SI, and HI at this time.  Pt states she did not sleep well last night reports she just sat in the recliner with her eyes closed.  Declined breakfast at this time.  Spoke to pt about medications she has ordered for later in the morning, pt stated she will not be taking those medications.  Educated pt on the importance of medication compliance.  Breathing is even and unlabored.  Will continue to monitor for safety.

## 2021-08-02 NOTE — ED Notes (Signed)
Pt sitting quietly in recliner chair.  Pt was noted laughing quietly to self.  Nurse inquired about what was making her laugh? Pt replied she was  "laughing at the conversation going on in the nurse's station."   No complaints of pain or discomfort at this time.  Will continue to monitor for safety.

## 2021-08-02 NOTE — ED Provider Notes (Signed)
FBC/OBS ASAP Discharge Summary  Date and Time: 08/02/2021 1:24 PM  Name: Monica Richardson  MRN:  [id number]   Discharge Diagnoses:  Final diagnoses:  Schizophrenia spectrum disorder with psychotic disorder type not yet determined [facility])  Involuntary commitment    Subjective: "Not really sure why I was brought here but I never threaten anyone and I never had a knife."  Monica Richardson is a 20 yr. with a history of schizophrenia spectrum disorder who presents to [facility] under involuntary commitment petition by her mother Soo Boivin [phone]); Per IVC "Respondent has been committed in August of this year and once released she has not taken her medication.  Respondent was found lying in the front yard of her home and has repeated that she would hurt herself.  Holding a knife in her hand.  Respondent has stated that she was going to kill her mother and is hallucinating and responding to the voices she admits she is hearing."  Per HPI initial assessment per Melbourne Abts, PA: Patient states that two days ago she went to her mother's house who was out of town. She states that she was lying on the porch and neighbor called the police. She states that a Conservator, museum/gallery used the "electronic doorbell" to contact her mother. She states that her mother then unlocked the doors remotely and gave her permission to stay there. Patient states that her mother returned home today. She states that she was sleeping when the deputies notified her that she had been involuntary committed. Patient adamantly denies that she is suicidal or homicidal. She denies auditory and visual hallucinations. No indication that she is responding to internal stimuli. She denies paranoia. No delusion elicited during this assessment.  On evaluation patient is alert and oriented x 4. She is initially calm and cooperative. She becomes irritated when discussing IVC. Speech is clear and coherent. Mood is irritable and affect is congruent with mood. Thought  process is coherent and thought content is logical. Denies audiovisual hallucinations. No indication that patient is responding to internal stimuli. No evidence of delusional thought content. Denies suicidal ideations. Denies homicidal ideations. Denies use of alcohol, marijuana, and other substances. UDS positive for marijuana. She was inpatient at [facility] 04/12/2021-04/24/2021. She was discharged on Depakote 500 mg BID, Risperdal 2 mg daily, and Risperdal 3 mg QHS. In March 2021, the patient was petitioned for IVC due to aggressive behavior towards her mother.  Monica Richardson seen face to face for psychiatric reassessment by this provider, consulted with Dr. Earlene Plater; and chart reviewed on 08/02/21.  On evaluation Monica Richardson reports she is not really sure why she was brought here but she has not threaten anyone and she has not had a knife."  Patient states she lives with her mother and she is employed.  States they usually get along when her mother is not home.  Patient denies suicidal/self-harm/homicidal ideation, psychosis, and paranoia.  Patient states she has been prescribed psychotropic medications in the past but doesn't feel she needs them so she doesn't take them.  States she doesn't have outpatient psychiatric services and is not interested in starting.  Patient states she is doing fine; working fine, eating and sleeping without difficulty, and is having no problems at all. During evaluation Monica Richardson is laying on bed in no acute distress.  She is alert, oriented x 4, calm, cooperative and attentive.  Her mood is euthymic with congruent affect.  She has normal speech, and behavior.  Objectively there is no evidence  of psychosis/mania or delusional thinking.  Patient is able to converse coherently, goal directed thoughts, no distractibility, or pre-occupation.  She also denies suicidal/self-harm/homicidal ideation, psychosis, and paranoia.  Patient answered question  appropriately.  Collateral information from patients nurse Isaiah Serge, RN reports patient has been calm and cooperative and that she has not notice patient responding to internal or external stimuli.  Reports that patient has refused all medications.     Stay Summary: Monica Richardson was admitted to [facility] continuous assessment unit for Schizophrenia spectrum disorder with psychotic disorder type not yet determined [facility]) under IVC petitioned by her mother for crisis management and stabilization.  She was monitored overnight for response to internal/external stimuli, aggressive behavior, and unstable mood.  Attempted to restart home medications which were declined by Monica Richardson by refusing to take medications ordered stating she doesn't; need any medications Assessment symptoms, emotional and mental status were monitored with continuous assessment/observation, and visualization by staff and patients report of symptom reduction; and patient's statement of no symptoms.  Patient has constantly denied suicidal/self-harm/homicidal ideation, psychosis, and paranoia.           Monica Richardson was evaluated for stability and plans for continued recovery upon discharge.  Monica Richardson motivation was an integral factor for scheduling further treatment.  Monica Richardson has declined outpatient psychiatric services stating she doesn't feel she needs it or medication management.  Other issues addressed during stay are:  Employment, transportation, health status, family support, and any pending legal issues were also considered during her during the 24 hours of continuous assessment/observation.  She was offered further treatment options upon discharge including but not limited to Outpatient psychiatric services, [facility] (for assistance with food, shelter/housing, paying bills, social work needs.).   Upon completion of this admission Monica Richardson was both mentally and medically stable for discharge denying  suicidal/homicidal ideation, auditory/visual/tactile hallucinations, delusional thoughts and  paranoia.       IVC rescinded:  After thorough evaluation and review of information currently presented on assessment of Monica Richardson, there is insufficient findings to indicate patient meets criteria for involuntary commitment or require an inpatient level of care.  She is alert, oriented x 4, calm, cooperative and attentive.  Her mood is euthymic with congruent affect.  She has normal speech, and behavior.  Objectively there is no evidence of psychosis/mania or delusional thinking.  Patient is able to converse coherently, goal directed thoughts, no distractibility, or pre-occupation.  She also denies suicidal/self-harm/homicidal ideation, psychosis, and paranoia.  Patient all answered question appropriately and has responded appropriately to staff and peers. At this time, she is not significantly impaired, psychotic, or manic on exam.  A detailed risk assessment has been completed based on clinical exam and individual risk factors.  Patient acute suicide risk is low.  Patient is educated verbalizes understanding of mental health resources and other crisis services in the community. She is instructed to call 911 and present to the nearest emergency room should she experience any suicidal/homicidal ideation, auditory/visual/hallucinations, or detrimental worsening of her mental health condition. Writer also advised the patient to call the toll-free phone on insurance card or Medicaid card to assist with identifying in network counselors and agencies.  Resources for outpatient psychiatric services and community services was also given.   Total Time spent with patient: 45 minutes  Past Psychiatric History: See above Past Medical History: History reviewed. No pertinent past medical history. History reviewed. No pertinent surgical history. Family History: History reviewed. No pertinent family history. Family  Psychiatric History: None noted Social History:  Social History   Substance and Sexual Activity  Alcohol Use No     Social History   Substance and Sexual Activity  Drug Use Yes  . Types: Marijuana    Social History   Socioeconomic History  . Marital status: Single    Spouse name: Not on file  . Number of children: Not on file  . Years of education: Not on file  . Highest education level: Not on file  Occupational History  . Not on file  Tobacco Use  . Smoking status: Never  . Smokeless tobacco: Never  Substance and Sexual Activity  . Alcohol use: No  . Drug use: Yes    Types: Marijuana  . Sexual activity: Not on file  Other Topics Concern  . Not on file  Social History Narrative  . Not on file   Social Determinants of Health   Financial Resource Strain: Not on file  Food Insecurity: Not on file  Transportation Needs: Not on file  Physical Activity: Not on file  Stress: Not on file  Social Connections: Not on file   SDOH:  SDOH Screenings   Alcohol Screen: Low Risk   . Last Alcohol Screening Score (AUDIT): 0  Depression (PHQ2-9): Not on file  Financial Resource Strain: Not on file  Food Insecurity: Not on file  Housing: Not on file  Physical Activity: Not on file  Social Connections: Not on file  Stress: Not on file  Tobacco Use: Low Risk   . Smoking Tobacco Use: Never  . Smokeless Tobacco Use: Never  . Passive Exposure: Not on file  Transportation Needs: Not on file    Tobacco Cessation:  N/A, patient does not currently use tobacco products  Current Medications:  Current Facility-Administered Medications  Medication Dose Route Frequency Provider Last Rate Last Admin  . acetaminophen (TYLENOL) tablet 650 mg  650 mg Oral Q6H PRN Jackelyn Poling, NP      . alum & mag hydroxide-simeth (MAALOX/MYLANTA) 200-200-20 MG/5ML suspension 30 mL  30 mL Oral Q4H PRN [name] A, NP      . divalproex (DEPAKOTE) DR tablet 250 mg  250 mg Oral BID [name] A,  NP      . hydrOXYzine (ATARAX/VISTARIL) tablet 25 mg  25 mg Oral TID PRN [name] A, NP      . magnesium hydroxide (MILK OF MAGNESIA) suspension 30 mL  30 mL Oral Daily PRN [name] A, NP      . risperiDONE (RISPERDAL) tablet 1 mg  1 mg Oral Daily [name] A, NP      . risperiDONE (RISPERDAL) tablet 1 mg  1 mg Oral QHS Jackelyn Poling, NP       Current Outpatient Medications  Medication Sig Dispense Refill  . divalproex (DEPAKOTE) 500 MG DR tablet Take 1 tablet (500 mg total) by mouth 2 (two) times daily. (Patient not taking: Reported on 08/02/2021) 60 tablet 0  . risperiDONE (RISPERDAL) 2 MG tablet Take 1 tablet (2 mg total) by mouth daily. (Patient not taking: Reported on 08/02/2021) 30 tablet 0  . risperiDONE (RISPERDAL) 3 MG tablet Take 1 tablet (3 mg total) by mouth at bedtime. (Patient not taking: Reported on 08/02/2021) 30 tablet 0    PTA Medications: (Not in a hospital admission)   Musculoskeletal  Strength & Muscle Tone: within normal limits Gait & Station: normal Patient leans: N/A  Psychiatric Specialty Exam  Presentation  General Appearance: Appropriate  for Environment  Eye Contact:Good  Speech:Clear and Coherent; Normal Rate  Speech Volume:Normal  Handedness:Right   Mood and Affect  Mood:Euthymic  Affect:Appropriate; Congruent   Thought Process  Thought Processes:Coherent; Goal Directed  Descriptions of Associations:Intact  Orientation:Full (Time, Place and Person)  Thought Content:Logical; WDL  Diagnosis of Schizophrenia or Schizoaffective disorder in past: No  Duration of Psychotic Symptoms: Less than six months   Hallucinations:Hallucinations: None  Ideas of Reference:None  Suicidal Thoughts:Suicidal Thoughts: No  Homicidal Thoughts:Homicidal Thoughts: No   Sensorium  Memory:Immediate Good; Recent Good; Remote Good  Judgment:Intact  Insight:Present (Patient aware of her condiction; doesn't feel she needs to take medications;  reporting her mood controlled, no psychosis, and continues to work and do well.)   Art therapist  Concentration:Good  Attention Span:Good  Recall:Good  Fund of Knowledge:Good  Language:Good   Psychomotor Activity  Psychomotor Activity:Psychomotor Activity: Normal   Assets  Assets:Communication Skills; Desire for Improvement; Housing; Physical Health; Resilience; Social Support   Sleep  Sleep:Sleep: Good   Nutritional Assessment (For OBS and FBC admissions only) Has the patient had a weight loss or gain of 10 pounds or more in the last 3 months?: No Has the patient had a decrease in food intake/or appetite?: No Does the patient have dental problems?: No Does the patient have eating habits or behaviors that may be indicators of an eating disorder including binging or inducing vomiting?: No Has the patient recently lost weight without trying?: 0 Has the patient been eating poorly because of a decreased appetite?: 0 Malnutrition Screening Tool Score: 0   Physical Exam  Physical Exam Constitutional:      General: She is not in acute distress.    Appearance: Normal appearance. She is not ill-appearing, toxic-appearing or diaphoretic.  Cardiovascular:     Rate and Rhythm: Normal rate.  Pulmonary:     Effort: Pulmonary effort is normal. No respiratory distress.  Musculoskeletal:        General: Normal range of motion.  Skin:    General: Skin is warm and dry.  Neurological:     Mental Status: She is alert and oriented to person, place, and time.  Psychiatric:        Attention and Perception: Attention and perception normal. She does not perceive auditory or visual hallucinations.        Mood and Affect: Mood and affect normal.        Speech: Speech normal.        Behavior: Behavior normal. Behavior is cooperative.        Thought Content: Thought content is not paranoid or delusional. Thought content does not include homicidal or suicidal ideation.         Cognition and Memory: Cognition and memory normal.        Judgment: Judgment normal.   Review of Systems  Constitutional:  Negative for chills, diaphoresis, fever, malaise/fatigue and weight loss.  HENT:  Negative for congestion.   Respiratory:  Negative for cough and shortness of breath.   Cardiovascular:  Negative for chest pain and palpitations.  Gastrointestinal:  Negative for diarrhea, nausea and vomiting.  Genitourinary: Negative.   Musculoskeletal: Negative.   Skin: Negative.   Neurological:  Negative for dizziness and seizures.  Psychiatric/Behavioral:  Negative for depression (Denies), hallucinations (Denies), memory loss and suicidal ideas (Denies). Substance abuse: THC.The patient is not nervous/anxious (Stable) and does not have insomnia.        Patient states she is fine.  Reports she doesn't need medication  and doesn't want to take any medications.  Reports she is working and doing fine without medication  All other systems reviewed and are negative. Blood pressure 128/64, pulse 69, temperature 98 F (36.7 C), temperature source Tympanic, SpO2 93 %. There is no height or weight on file to calculate BMI.  Demographic Factors:  Black female  Loss Factors: NA  Historical Factors: Impulsivity  Risk Reduction Factors:   Religious beliefs about death, Employed, Living with another person, especially a relative, Positive social support, and Positive coping skills or problem solving skills  Continued Clinical Symptoms:  Previous Psychiatric Diagnoses and Treatments  Cognitive Features That Contribute To Risk:  None    Suicide Risk:  Minimal: No identifiable suicidal ideation.  Patients presenting with no risk factors but with morbid ruminations; may be classified as minimal risk based on the severity of the depressive symptoms  Plan Of Care/Follow-up recommendations:  Activity:  Resume normal activity Other:  Follow up with resources given  Disposition: No evidence  of imminent risk to self or others at present.   Patient does not meet criteria for psychiatric inpatient admission. Supportive therapy provided about ongoing stressors. Discussed crisis plan, support from social network, calling 911, coming to the Emergency Department, and calling Suicide Hotline.   Jamaurie Bernier, NP 08/02/2021, 1:24 PM

## 2021-08-02 NOTE — Discharge Instructions (Addendum)
Mobile Crisis Response Teams Listed by counties in vicinity of [place] providers [place] [facility] 1-877-626-1772 [place] [facility] 1-888-581-9988 [place] [facility] [phone] [place] [facility] [phone] [place]                * [facility] 1-866-275-9552                * [facility] 1-800-939-5911  [place] [facility] 1-877-626-1772 [place] * [facility]  [phone] * [facility] 1-800-939-5911      Please Follow up with Outpatient Services  [facility] (Therapy only) (takes Medicaid and most major insurances) [place]:  [address] [place], [place] [address] Phone: [phone] [place]:  [address], [place], [place] [address]   Phone: [phone] [place]:  [address], [place], [place] [address]  Phone: [phone]  [facility]:   (will take occasional Medicaid. Takes most major insurances in network) [place]: [address], [place] Phone:[phone]  [place]: [address], [place] Phone:[phone] [place]: [address] [place] Phone: [phone] [place]: [address], [place] Phone: [phone]  [facility]  (takes sliding scale and Medicaid as well as other major insurances)  [place]:   [address], [place] [address]  Phone: [phone] [place]:   [facility] [address], [place], [place] [address]   Phone: [phone]  [facility]  (medication, therapy, intensive in home services)  [address], [address] [place], [place] [address] [phone]  [facility] (Therapy only)  Specializes in LGBTQ, perinatal mood disorders, anxiety and  depression [address] [place], [place] [address] [phone]  [facility] (MST Intensive in-home services) [address] [place], [place] [address] [phone]  [facility]  (intensive in home services) [address] [place], [place] [address] [phone]  [facility]  (medication and therapy) [address], [place], [place] [address] [place], [place] [address] [phone]  [facility] [address] [place], [place] [address] Website:  info@[facility].org Phone: 1-844-HOPEWAY Fax: [phone]  [facility] (medication) [address], [place], [place] [address] Phone: [phone]  [facility] (intensive in-home, medication management) [address] [place], [place] [address] [phone]  [facility]  (specializes in trauma therapy) [address],  [place], [place] [address] [phone]  [facility] (Psychological testing) [address],  [place], [place] [address] [phone]  [facility]  [address]  [place],  [address] Phone:  [phone] Website:  [email or web address]  

## 2021-08-04 ENCOUNTER — Telehealth (HOSPITAL_COMMUNITY): Payer: Self-pay | Admitting: Emergency Medicine

## 2021-08-04 NOTE — BH Assessment (Signed)
Care Management - Follow Up Discharges   Writer attempted to make contact with patient today and was unsuccessful.  Writer left a HIPPA compliant voice message.   Per chart review, patient was provided with outpatient resources.   

## 2021-08-31 ENCOUNTER — Ambulatory Visit (HOSPITAL_COMMUNITY)
Admission: EM | Admit: 2021-08-31 | Discharge: 2021-08-31 | Disposition: A | Discharge status: Other Acute Inpatient Hospital | Payer: 59 | Attending: Student | Admitting: Student

## 2021-08-31 ENCOUNTER — Encounter (HOSPITAL_COMMUNITY): Payer: Self-pay | Admitting: Registered Nurse

## 2021-08-31 ENCOUNTER — Other Ambulatory Visit: Payer: Self-pay

## 2021-08-31 DIAGNOSIS — F1210 Cannabis abuse, uncomplicated: Secondary | ICD-10-CM | POA: Diagnosis not present

## 2021-08-31 DIAGNOSIS — F259 Schizoaffective disorder, unspecified: Secondary | ICD-10-CM | POA: Insufficient documentation

## 2021-08-31 DIAGNOSIS — Z046 Encounter for general psychiatric examination, requested by authority: Secondary | ICD-10-CM

## 2021-08-31 DIAGNOSIS — F29 Unspecified psychosis not due to a substance or known physiological condition: Secondary | ICD-10-CM | POA: Diagnosis present

## 2021-08-31 DIAGNOSIS — T426X6A Underdosing of other antiepileptic and sedative-hypnotic drugs, initial encounter: Secondary | ICD-10-CM | POA: Insufficient documentation

## 2021-08-31 DIAGNOSIS — R4689 Other symptoms and signs involving appearance and behavior: Secondary | ICD-10-CM | POA: Diagnosis not present

## 2021-08-31 DIAGNOSIS — Z20822 Contact with and (suspected) exposure to covid-19: Secondary | ICD-10-CM | POA: Insufficient documentation

## 2021-08-31 LAB — CBC WITH DIFFERENTIAL/PLATELET
Abs Immature Granulocytes: 0.01 10*3/uL (ref 0.00–0.07)
Basophils Absolute: 0.1 10*3/uL (ref 0.0–0.1)
Basophils Relative: 1 %
Eosinophils Absolute: 0.1 10*3/uL (ref 0.0–0.5)
Eosinophils Relative: 2 %
HCT: 36 % (ref 36.0–46.0)
Hemoglobin: 11.3 g/dL — ABNORMAL LOW (ref 12.0–15.0)
Immature Granulocytes: 0 %
Lymphocytes Relative: 45 %
Lymphs Abs: 2.6 10*3/uL (ref 0.7–4.0)
MCH: 27 pg (ref 26.0–34.0)
MCHC: 31.4 g/dL (ref 30.0–36.0)
MCV: 86.1 fL (ref 80.0–100.0)
Monocytes Absolute: 0.4 10*3/uL (ref 0.1–1.0)
Monocytes Relative: 7 %
Neutro Abs: 2.5 10*3/uL (ref 1.7–7.7)
Neutrophils Relative %: 45 %
Platelets: 242 10*3/uL (ref 150–400)
RBC: 4.18 MIL/uL (ref 3.87–5.11)
RDW: 15.5 % (ref 11.5–15.5)
WBC: 5.6 10*3/uL (ref 4.0–10.5)
nRBC: 0 % (ref 0.0–0.2)

## 2021-08-31 LAB — URINALYSIS, ROUTINE W REFLEX MICROSCOPIC
Bilirubin Urine: NEGATIVE
Glucose, UA: NEGATIVE mg/dL
Hgb urine dipstick: NEGATIVE
Ketones, ur: 5 mg/dL — AB
Nitrite: NEGATIVE
Protein, ur: NEGATIVE mg/dL
Specific Gravity, Urine: 1.013 (ref 1.005–1.030)
pH: 7 (ref 5.0–8.0)

## 2021-08-31 LAB — COMPREHENSIVE METABOLIC PANEL
ALT: 15 U/L (ref 0–44)
AST: 22 U/L (ref 15–41)
Albumin: 4.4 g/dL (ref 3.5–5.0)
Alkaline Phosphatase: 42 U/L (ref 38–126)
Anion gap: 6 (ref 5–15)
BUN: 13 mg/dL (ref 6–20)
CO2: 24 mmol/L (ref 22–32)
Calcium: 9.1 mg/dL (ref 8.9–10.3)
Chloride: 104 mmol/L (ref 98–111)
Creatinine, Ser: 0.86 mg/dL (ref 0.44–1.00)
GFR, Estimated: >60 mL/min (ref >60)
Glucose, Bld: 86 mg/dL (ref 70–99)
Potassium: 3.2 mmol/L — ABNORMAL LOW (ref 3.5–5.1)
Sodium: 134 mmol/L — ABNORMAL LOW (ref 135–145)
Total Bilirubin: 0.7 mg/dL (ref 0.3–1.2)
Total Protein: 7.1 g/dL (ref 6.5–8.1)

## 2021-08-31 LAB — VALPROIC ACID LEVEL: Valproic Acid Lvl: <10 ug/mL — ABNORMAL LOW (ref 50.0–100.0)

## 2021-08-31 LAB — LIPID PANEL
Cholesterol: 196 mg/dL (ref 0–200)
HDL: 43 mg/dL (ref >40)
LDL Cholesterol: 132 mg/dL — ABNORMAL HIGH (ref 0–99)
Total CHOL/HDL Ratio: 4.6 RATIO
Triglycerides: 103 mg/dL (ref <150)
VLDL: 21 mg/dL (ref 0–40)

## 2021-08-31 LAB — PREGNANCY, URINE: Preg Test, Ur: NEGATIVE

## 2021-08-31 LAB — POCT URINE DRUG SCREEN - MANUAL ENTRY (I-SCREEN)
POC Amphetamine UR: NOT DETECTED
POC Buprenorphine (BUP): NOT DETECTED
POC Cocaine UR: NOT DETECTED
POC Marijuana UR: POSITIVE — AB
POC Methadone UR: NOT DETECTED
POC Methamphetamine UR: NOT DETECTED
POC Morphine: NOT DETECTED
POC Oxazepam (BZO): NOT DETECTED
POC Oxycodone UR: NOT DETECTED
POC Secobarbital (BAR): NOT DETECTED

## 2021-08-31 LAB — RESP PANEL BY RT-PCR (FLU A&B, COVID) ARPGX2
Influenza A by PCR: NEGATIVE
Influenza B by PCR: NEGATIVE
SARS Coronavirus 2 by RT PCR: NEGATIVE

## 2021-08-31 LAB — POC SARS CORONAVIRUS 2 AG: SARSCOV2ONAVIRUS 2 AG: NEGATIVE

## 2021-08-31 LAB — POC SARS CORONAVIRUS 2 AG -  ED: SARS Coronavirus 2 Ag: NEGATIVE

## 2021-08-31 LAB — HEMOGLOBIN A1C
Hgb A1c MFr Bld: 5.2 % (ref 4.8–5.6)
Mean Plasma Glucose: 102.54 mg/dL

## 2021-08-31 LAB — TSH: TSH: 2.41 u[IU]/mL (ref 0.350–4.500)

## 2021-08-31 LAB — ETHANOL: Alcohol, Ethyl (B): <10 mg/dL (ref <10)

## 2021-08-31 LAB — POCT PREGNANCY, URINE: Preg Test, Ur: NEGATIVE

## 2021-08-31 MED ORDER — TRAZODONE HCL 50 MG PO TABS: 50.0000 mg | ORAL_TABLET | Freq: Every evening | ORAL | Status: DC | PRN

## 2021-08-31 MED ORDER — HYDROXYZINE HCL 25 MG PO TABS: 25.0000 mg | ORAL_TABLET | Freq: Three times a day (TID) | ORAL | 0 refills | Status: DC | PRN

## 2021-08-31 MED ORDER — HYDROXYZINE HCL 25 MG PO TABS: 25.0000 mg | ORAL_TABLET | Freq: Three times a day (TID) | ORAL | Status: DC | PRN

## 2021-08-31 MED ORDER — ALUM & MAG HYDROXIDE-SIMETH 200-200-20 MG/5ML PO SUSP: 30.0000 mL | ORAL | Status: DC | PRN

## 2021-08-31 MED ORDER — DIVALPROEX SODIUM 250 MG PO DR TAB: 250.0000 mg | DELAYED_RELEASE_TABLET | Freq: Two times a day (BID) | ORAL | 0 refills | Status: DC

## 2021-08-31 MED ORDER — RISPERIDONE 1 MG PO TABS: 1.0000 mg | ORAL_TABLET | Freq: Two times a day (BID) | ORAL | Status: DC

## 2021-08-31 MED ORDER — POTASSIUM CHLORIDE CRYS ER 20 MEQ PO TBCR
40.0000 meq | EXTENDED_RELEASE_TABLET | Freq: Once | ORAL | Status: DC
  Filled 2021-08-31: qty 2

## 2021-08-31 MED ORDER — ACETAMINOPHEN 325 MG PO TABS: 650.0000 mg | ORAL_TABLET | Freq: Four times a day (QID) | ORAL | Status: DC | PRN

## 2021-08-31 MED ORDER — MAGNESIUM HYDROXIDE 400 MG/5ML PO SUSP: 30.0000 mL | Freq: Every day | ORAL | Status: DC | PRN

## 2021-08-31 MED ORDER — DIVALPROEX SODIUM 250 MG PO DR TAB: 250.0000 mg | DELAYED_RELEASE_TABLET | Freq: Two times a day (BID) | ORAL | Status: DC
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
## 2021-08-31 NOTE — ED Provider Notes (Signed)
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 mg/dL Final   LDL Cholesterol 04/12/2021 138 (H)  0 - 99 mg/dL Final   Comment:        Total Cholesterol/HDL:CHD Risk Coronary Heart Disease Risk Table                     Men   Women  1/2 Average Risk   3.4   3.3  Average Risk       5.0   4.4  2 X Average Risk   9.6   7.1  3 X Average Risk  23.4   11.0        Use the calculated Patient Ratio above and the CHD Risk Table to determine the patient's CHD Risk.        ATP III  CLASSIFICATION (LDL):  <100     mg/dL   Optimal  100-129  mg/dL   Near or Above                    Optimal  130-159  mg/dL   Borderline  465-035  mg/dL   High  >465     mg/dL   Very High Performed at [facility], [address] [place], [place] [address]    TSH 04/12/2021 1.988  0.350 - 4.500 uIU/mL Final   Comment: Performed by a 3rd Generation assay with a functional sensitivity of <=0.01 uIU/mL. Performed at [facility], [address], [place], [place] [address]    Anti Nuclear Antibody (ANA) 04/13/2021 Negative  Negative Final   Comment: (NOTE) Performed At: [facility] [address] [place], [place] [id number] Jolene Schimke MD [id number]    Ceruloplasmin 04/13/2021 19.9  19.0 - 39.0 mg/dL Final   Comment: (NOTE) Performed At: [facility] [address] [place], [place] [id number] Jolene Schimke MD JQ:[id number]    RPR Ser Ql 04/13/2021 NON REACTIVE  NON REACTIVE Final   Performed at [facility], [address], [place], [place] [address]   Sed Rate 04/13/2021 6  0 - 22 mm/hr Final   Performed at [facility], [address] [place], [place] [address]   Vitamin B-12 04/13/2021 329  180 - 914 pg/mL Final   Comment: (NOTE) This assay is not validated for testing neonatal or myeloproliferative syndrome specimens for Vitamin B12 levels. Performed at [facility], [address], [place], [place] [address]    Sodium 04/13/2021 139  135 - 145 mmol/L Final   Potassium 04/13/2021 4.6  3.5 - 5.1 mmol/L Final   Chloride 04/13/2021 105  98 - 111 mmol/L Final   CO2 04/13/2021 24  22 - 32 mmol/L Final   Glucose, Bld 04/13/2021 130 (H)  70 - 99 mg/dL Final   Glucose reference range applies only to samples taken after fasting for at least 8 hours.   BUN 04/13/2021 16  6 - 20 mg/dL Final   Creatinine, Ser 04/13/2021 0.87  0.44 - 1.00 mg/dL Final   Calcium 89/37/3428 10.2  8.9  - 10.3 mg/dL Final   Total Protein 76/81/1572 8.1  6.5 - 8.1 g/dL Final   Albumin 62/11/5595 5.0  3.5 - 5.0 g/dL Final   AST 41/63/8453 23  15 - 41 U/L Final   ALT 04/13/2021 13  0 - 44 U/L Final   Alkaline Phosphatase 04/13/2021 55  38 - 126 U/L Final   Total Bilirubin 04/13/2021 0.6  0.3 - 1.2 mg/dL Final   GFR, Estimated 04/13/2021 >60  >60 mL/min Final   Comment: (NOTE) Calculated using the CKD-EPI Creatinine Equation (2021)    Anion gap 04/13/2021 10  5 - 15 Final   Performed at [facility], [address], [place], [place] [address]   SARS Coronavirus 2 by RT PCR 04/19/2021 NEGATIVE  NEGATIVE Final   Comment: (NOTE) SARS-CoV-2 target nucleic acids are NOT DETECTED.  The SARS-CoV-2 RNA is generally detectable in upper respiratory specimens during the acute phase of infection. The lowest concentration of SARS-CoV-2 viral copies this assay can detect is 138 copies/mL. A negative result does not preclude SARS-Cov-2 infection and should not be used as the sole basis for treatment or other patient management decisions. A negative result may occur with  improper specimen collection/handling, submission of specimen other than nasopharyngeal swab, presence of viral mutation(s) within the areas targeted  by this assay, and inadequate number of viral copies(<138 copies/mL). A negative result must be combined with clinical observations, patient history, and epidemiological information. The expected result is Negative.  Fact Sheet for Patients:  BloggerCourse.com  Fact Sheet for Healthcare Providers:  SeriousBroker.it  This test is no                          t yet approved or cleared by the [place] FDA and  has been authorized for detection and/or diagnosis of SARS-CoV-2 by FDA under an Emergency Use Authorization (EUA). This EUA will remain  in effect (meaning this test can be used) for the duration of  the COVID-19 declaration under Section 564(b)(1) of the Act, 21 U.S.C.section 360bbb-3(b)(1), unless the authorization is terminated  or revoked sooner.       Influenza A by PCR 04/19/2021 NEGATIVE  NEGATIVE Final   Influenza B by PCR 04/19/2021 NEGATIVE  NEGATIVE Final   Comment: (NOTE) The Xpert Xpress SARS-CoV-2/FLU/RSV plus assay is intended as an aid in the diagnosis of influenza from Nasopharyngeal swab specimens and should not be used as a sole basis for treatment. Nasal washings and aspirates are unacceptable for Xpert Xpress SARS-CoV-2/FLU/RSV testing.  Fact Sheet for Patients: BloggerCourse.com  Fact Sheet for Healthcare Providers: SeriousBroker.it  This test is not yet approved or cleared by the [place] FDA and has been authorized for detection and/or diagnosis of SARS-CoV-2 by FDA under an Emergency Use Authorization (EUA). This EUA will remain in effect (meaning this test can be used) for the duration of the COVID-19 declaration under Section 564(b)(1) of the Act, 21 U.S.C. section 360bbb-3(b)(1), unless the authorization is terminated or revoked.  Performed at [facility], [address], [place], [place] [address]    Color, Urine 04/22/2021 STRAW (A)  YELLOW Final   APPearance 04/22/2021 CLEAR  CLEAR Final   Specific Gravity, Urine 04/22/2021 1.012  1.005 - 1.030 Final   pH 04/22/2021 7.0  5.0 - 8.0 Final   Glucose, UA 04/22/2021 NEGATIVE  NEGATIVE mg/dL Final   Hgb urine dipstick 04/22/2021 NEGATIVE  NEGATIVE Final   Bilirubin Urine 04/22/2021 NEGATIVE  NEGATIVE Final   Ketones, ur 04/22/2021 5 (A)  NEGATIVE mg/dL Final   Protein, ur 60/45/4098 NEGATIVE  NEGATIVE mg/dL Final   Nitrite 11/91/4782 NEGATIVE  NEGATIVE Final   Leukocytes,Ua 04/22/2021 TRACE (A)  NEGATIVE Final   RBC / HPF 04/22/2021 0-5  0 - 5 RBC/hpf Final   WBC, UA 04/22/2021 0-5  0 - 5 WBC/hpf Final   Bacteria, UA  04/22/2021 NONE SEEN  NONE SEEN Final   Squamous Epithelial / LPF 04/22/2021 0-5  0 - 5 Final   Mucus 04/22/2021 PRESENT   Final   Performed at [facility], [address] [place], [place] [address]   Chlamydia 04/21/2021 Positive (A)   Final   Neisseria Gonorrhea 04/21/2021 Negative   Final   Comment 04/21/2021 Normal Reference Ranger Chlamydia - Negative   Final   Comment 04/21/2021 Normal Reference Range Neisseria Gonorrhea - Negative   Final   Valproic Acid Lvl 04/22/2021 57  50.0 - 100.0 ug/mL Final   Performed at [facility], [address] [place], [place] [address]   WBC 04/24/2021 4.2  4.0 - 10.5 K/uL Final   RBC 04/24/2021 4.31  3.87 - 5.11 MIL/uL Final   Hemoglobin 04/24/2021 11.9 (L)  12.0 - 15.0 g/dL Final   HCT  04/24/2021 37.1  36.0 - 46.0 % Final   MCV 04/24/2021 86.1  80.0 - 100.0 fL Final   MCH 04/24/2021 27.6  26.0 - 34.0 pg Final   MCHC 04/24/2021 32.1  30.0 - 36.0 g/dL Final   RDW 16/06/9603 15.9 (H)  11.5 - 15.5 % Final   Platelets 04/24/2021 183  150 - 400 K/uL Final   nRBC 04/24/2021 0.0  0.0 - 0.2 % Final   Neutrophils Relative % 04/24/2021 45  % Final   Neutro Abs 04/24/2021 2.0  1.7 - 7.7 K/uL Final   Lymphocytes Relative 04/24/2021 42  % Final   Lymphs Abs 04/24/2021 1.8  0.7 - 4.0 K/uL Final   Monocytes Relative 04/24/2021 10  % Final   Monocytes Absolute 04/24/2021 0.4  0.1 - 1.0 K/uL Final   Eosinophils Relative 04/24/2021 2  % Final   Eosinophils Absolute 04/24/2021 0.1  0.0 - 0.5 K/uL Final   Basophils Relative 04/24/2021 1  % Final   Basophils Absolute 04/24/2021 0.0  0.0 - 0.1 K/uL Final   Immature Granulocytes 04/24/2021 0  % Final   Abs Immature Granulocytes 04/24/2021 0.01  0.00 - 0.07 K/uL Final   Performed at [facility], [address], [place], [place] [address]   Sodium 04/24/2021 141  135 - 145 mmol/L Final   Potassium 04/24/2021 4.0  3.5 - 5.1 mmol/L Final   Chloride 04/24/2021  106  98 - 111 mmol/L Final   CO2 04/24/2021 25  22 - 32 mmol/L Final   Glucose, Bld 04/24/2021 112 (H)  70 - 99 mg/dL Final   Glucose reference range applies only to samples taken after fasting for at least 8 hours.   BUN 04/24/2021 11  6 - 20 mg/dL Final   Creatinine, Ser 04/24/2021 0.87  0.44 - 1.00 mg/dL Final   Calcium 11/91/4782 9.5  8.9 - 10.3 mg/dL Final   Total Protein 95/62/1308 7.3  6.5 - 8.1 g/dL Final   Albumin 65/78/4696 4.3  3.5 - 5.0 g/dL Final   AST 29/52/8413 25  15 - 41 U/L Final   ALT 04/24/2021 14  0 - 44 U/L Final   Alkaline Phosphatase 04/24/2021 48  38 - 126 U/L Final   Total Bilirubin 04/24/2021 0.6  0.3 - 1.2 mg/dL Final   GFR, Estimated 04/24/2021 >60  >60 mL/min Final   Comment: (NOTE) Calculated using the CKD-EPI Creatinine Equation (2021)    Anion gap 04/24/2021 10  5 - 15 Final   Performed at [facility], [address], [place], [place] [address]   Valproic Acid Lvl 04/24/2021 100  50.0 - 100.0 ug/mL Final   Performed at [facility], [address], [place], [place] [address]  Admission on 04/12/2021, Discharged on 04/12/2021  Component Date Value Ref Range Status   Preg Test, Ur 04/12/2021 NEGATIVE  NEGATIVE Final   Comment:        THE SENSITIVITY OF THIS METHODOLOGY IS >20 mIU/mL. Performed at [facility], [address] [place], [place] [address]    Opiates 04/12/2021 NONE DETECTED  NONE DETECTED Final   Cocaine 04/12/2021 NONE DETECTED  NONE DETECTED Final   Benzodiazepines 04/12/2021 NONE DETECTED  NONE DETECTED Final   Amphetamines 04/12/2021 NONE DETECTED  NONE DETECTED Final   Tetrahydrocannabinol 04/12/2021 POSITIVE (A)  NONE DETECTED Final   Barbiturates 04/12/2021 NONE DETECTED  NONE DETECTED Final   Comment: (NOTE) DRUG SCREEN FOR MEDICAL PURPOSES ONLY.  IF  CONFIRMATION IS NEEDED FOR ANY PURPOSE, NOTIFY LAB WITHIN 5 DAYS.  LOWEST DETECTABLE LIMITS FOR URINE DRUG  SCREEN Drug Class                     Cutoff (ng/mL) Amphetamine and metabolites    1000 Barbiturate and metabolites    200 Benzodiazepine                 200 Tricyclics and metabolites     300 Opiates and metabolites        300 Cocaine and metabolites        300 THC                            50 Performed at [facility], [address], [place], [place] [address]   Admission on 04/11/2021, Discharged on 04/12/2021  Component Date Value Ref Range Status   SARS Coronavirus 2 Ag 04/12/2021 Negative  Negative Preliminary   WBC 04/12/2021 6.2  4.0 - 10.5 K/uL Final   RBC 04/12/2021 4.24  3.87 - 5.11 MIL/uL Final   Hemoglobin 04/12/2021 11.6 (L)  12.0 - 15.0 g/dL Final   HCT 35/57/3220 36.4  36.0 - 46.0 % Final   MCV 04/12/2021 85.8  80.0 - 100.0 fL Final   MCH 04/12/2021 27.4  26.0 - 34.0 pg Final   MCHC 04/12/2021 31.9  30.0 - 36.0 g/dL Final   RDW 25/42/7062 16.3 (H)  11.5 - 15.5 % Final   Platelets 04/12/2021 193  150 - 400 K/uL Final   nRBC 04/12/2021 0.0  0.0 - 0.2 % Final   Performed at [facility], [address], [place], [place] [address]   Sodium 04/12/2021 136  135 - 145 mmol/L Final   Potassium 04/12/2021 3.0 (L)  3.5 - 5.1 mmol/L Final   Chloride 04/12/2021 105  98 - 111 mmol/L Final   CO2 04/12/2021 22  22 - 32 mmol/L Final   Glucose, Bld 04/12/2021 83  70 - 99 mg/dL Final   Glucose reference range applies only to samples taken after fasting for at least 8 hours.   BUN 04/12/2021 10  6 - 20 mg/dL Final   Creatinine, Ser 04/12/2021 0.87  0.44 - 1.00 mg/dL Final   Calcium 31/51/7616 9.6  8.9 - 10.3 mg/dL Final   Total Protein 07/37/1062 7.7  6.5 - 8.1 g/dL Final   Albumin 69/48/5462 4.6  3.5 - 5.0 g/dL Final   AST 70/35/0093 26  15 - 41 U/L Final   ALT 04/12/2021 15  0 - 44 U/L Final   Alkaline Phosphatase 04/12/2021 48  38 - 126 U/L Final   Total Bilirubin 04/12/2021 0.8  0.3 - 1.2 mg/dL Final   GFR, Estimated 04/12/2021 >60  >60 mL/min  Final   Comment: (NOTE) Calculated using the CKD-EPI Creatinine Equation (2021)    Anion gap 04/12/2021 9  5 - 15 Final   Performed at [facility], [address] [place], [place] [address]   SARS Coronavirus 2 by RT PCR 04/12/2021 NEGATIVE  NEGATIVE Final   Comment: (NOTE) SARS-CoV-2 target nucleic acids are NOT DETECTED.  The SARS-CoV-2 RNA is generally detectable in upper respiratory specimens during the acute phase of infection. The lowest concentration of SARS-CoV-2 viral copies this assay can detect is 138 copies/mL. A negative result does not preclude SARS-Cov-2 infection and should not be used as the sole basis for  treatment or other patient management decisions. A negative result may occur with  improper specimen collection/handling, submission of specimen other than nasopharyngeal swab, presence of viral mutation(s) within the areas targeted by this assay, and inadequate number of viral copies(<138 copies/mL). A negative result must be combined with clinical observations, patient history, and epidemiological information. The expected result is Negative.  Fact Sheet for Patients:  BloggerCourse.com  Fact Sheet for Healthcare Providers:  SeriousBroker.it  This test is no                          t yet approved or cleared by the [place] FDA and  has been authorized for detection and/or diagnosis of SARS-CoV-2 by FDA under an Emergency Use Authorization (EUA). This EUA will remain  in effect (meaning this test can be used) for the duration of the COVID-19 declaration under Section 564(b)(1) of the Act, 21 U.S.C.section 360bbb-3(b)(1), unless the authorization is terminated  or revoked sooner.       Influenza A by PCR 04/12/2021 NEGATIVE  NEGATIVE Final   Influenza B by PCR 04/12/2021 NEGATIVE  NEGATIVE Final   Comment: (NOTE) The Xpert Xpress SARS-CoV-2/FLU/RSV plus assay is intended as an aid in the  diagnosis of influenza from Nasopharyngeal swab specimens and should not be used as a sole basis for treatment. Nasal washings and aspirates are unacceptable for Xpert Xpress SARS-CoV-2/FLU/RSV testing.  Fact Sheet for Patients: BloggerCourse.com  Fact Sheet for Healthcare Providers: SeriousBroker.it  This test is not yet approved or cleared by the [place] FDA and has been authorized for detection and/or diagnosis of SARS-CoV-2 by FDA under an Emergency Use Authorization (EUA). This EUA will remain in effect (meaning this test can be used) for the duration of the COVID-19 declaration under Section 564(b)(1) of the Act, 21 U.S.C. section 360bbb-3(b)(1), unless the authorization is terminated or revoked.  Performed at [facility], [address], [place], [place] [address]     Blood Alcohol level:  Lab Results  Component Value Date   [facility] <10 08/31/2021   ETH <10 08/02/2021    Metabolic Disorder Labs: Lab Results  Component Value Date   HGBA1C 5.2 08/31/2021   MPG 102.54 08/31/2021   MPG 108.28 04/12/2021   No results found for: PROLACTIN Lab Results  Component Value Date   CHOL 196 08/31/2021   TRIG 103 08/31/2021   HDL 43 08/31/2021   CHOLHDL 4.6 08/31/2021   VLDL 21 08/31/2021   LDLCALC 132 (H) 08/31/2021   LDLCALC 138 (H) 04/12/2021    Therapeutic Lab Levels: No results found for: LITHIUM Lab Results  Component Value Date   VALPROATE <10 (L) 08/31/2021   VALPROATE <10 (L) 08/02/2021   No components found for:  CBMZ  Physical Findings   AIMS    Flowsheet Row [facility]  AIMS Total Score 0      Flowsheet Row ED from 08[facility]31/2021 in [facility] from [facility]02/2021 in [facility] (Discharged[facility]   C-SSRS RISK CATEGORY No Risk No Risk No Risk        Musculoskeletal  Strength & Muscle Tone: within normal limits Gait & Station: normal Patient leans: N/A  Psychiatric Specialty Exam  Presentation  General Appearance: Appropriate for Environment  Eye Contact:Good  Speech:Clear and Coherent  Speech Volume:Decreased  Handedness:Right  Mood and Affect  Mood:Depressed  Affect:Flat   Thought Process  Thought Processes:Coherent  Descriptions of Associations:Intact  Orientation:Full (Time, Place and Person)  Thought Content:Logical  Diagnosis of Schizophrenia or Schizoaffective disorder in past: Yes  Duration of Psychotic Symptoms: Less than six months   Hallucinations:Hallucinations: None  Ideas of Reference:None  Suicidal Thoughts:Suicidal Thoughts: No  Homicidal Thoughts:Homicidal Thoughts: No   Sensorium  Memory:Immediate Good  Judgment:Fair  Insight:Present   Executive Functions  Concentration:Good  Attention Span:Good  Recall:Good  Fund of Knowledge:Good  Language:Good   Psychomotor Activity  Psychomotor Activity:Psychomotor Activity: Normal   Assets  Assets:Communication Skills; Housing; Leisure Time; Resilience; Social Support   Sleep  Sleep:Sleep: Good Number of Hours of Sleep: 8   Nutritional Assessment (For OBS and FBC admissions only) Has the patient had a weight loss or gain of 10 pounds or more in the last 3 months?: No Has the patient had a decrease in food intake/or appetite?: No Does the patient have dental problems?: No Does the patient have eating habits or behaviors that may be indicators of an eating disorder including binging or inducing vomiting?: No Has the patient recently lost weight without trying?: 0 Has the patient been eating poorly because of a decreased appetite?: 0 Malnutrition Screening Tool Score: 0    Physical Exam  Physical Exam Vitals reviewed.  Constitutional:      General: She is not  in acute distress.    Appearance: She is not ill-appearing, toxic-appearing or diaphoretic.  HENT:     Head: Normocephalic and atraumatic.     Right Ear: External ear normal.     Left Ear: External ear normal.     Nose: Nose normal.  Eyes:     General:        Right eye: No discharge.        Left eye: No discharge.     Conjunctiva/sclera: Conjunctivae normal.  Cardiovascular:     Comments: Bradycardia noted with pulse of 52 bpm (checked manually).  Based on patient's current presentation and patient's lack of physical symptoms, do not suspect that patient's bradycardia is indicative of an emergent medical condition at this time. Pulmonary:     Effort: Pulmonary effort is normal. No respiratory distress.  Musculoskeletal:        General: Normal range of motion.     Cervical back: Normal range of motion.  Neurological:     General: No focal deficit present.     Mental Status: She is alert and oriented to person, place, and time.     Comments: No tremor noted.   Psychiatric:        Mood and Affect: Mood and affect normal.        Speech: Speech normal.        Behavior: Behavior is not agitated, slowed, aggressive, withdrawn, hyperactive or combative. Behavior is cooperative.        Thought Content: Thought content is not paranoid or delusional. Thought content does not include homicidal or suicidal ideation.     Comments: Patient denies AVH. However, per IVC paperwork, "Respondent has been cursing and arguing to people that do not exist".   Review of Systems  Constitutional:  Negative for chills, diaphoresis, fever, malaise/fatigue and weight loss.  HENT:  Negative for congestion.   Respiratory:  Negative for cough and shortness of breath.   Cardiovascular:  Negative for chest pain and palpitations.  Gastrointestinal:  Negative for abdominal pain, constipation, diarrhea, nausea and vomiting.  Musculoskeletal:  Negative  for joint pain and myalgias.  Neurological:  Negative for  dizziness and headaches.  Psychiatric/Behavioral:  Negative for depression, memory loss and suicidal ideas. The patient is not nervous/anxious.        Patient denies AVH. However, per IVC paperwork, "Respondent has been cursing and arguing to people that do not exist".  Patient denies substance abuse.  However, UDS is positive for marijuana.  Insomnia documented per IVC paperwork, but patient denies insomnia.  All other systems reviewed and are negative. Blood pressure (!) 119/58, pulse (!) 52, temperature 97.9 F (36.6 C), temperature source Oral, resp. rate 16, SpO2 99 %. There is no height or weight on file to calculate BMI.  This is patient second IVC in last 30 days.  Patient presentation on first observation appears normal and patient is able to hold it together.  However this visit patient has shown that there may be some anger issues that she did not present with while under the first IVC petition.  For safety and stabilization will uphold IVC petition and monitor patient for 24 to 48 hours to make sure that patient is not a danger to herself or others.    Treatment Plan Summary: Daily contact with patient to assess and evaluate symptoms and progress in treatment, Medication management, and Plan Inpatient psychiatric treatment  First exam completed Medication Management Meds ordered this encounter  Medications   acetaminophen (TYLENOL) tablet 650 mg   alum & mag hydroxide-simeth (MAALOX/MYLANTA) 200-200-20 MG/5ML suspension 30 mL   magnesium hydroxide (MILK OF MAGNESIA) suspension 30 mL   hydrOXYzine (ATARAX) tablet 25 mg   traZODone (DESYREL) tablet 50 mg   risperiDONE (RISPERDAL) tablet 1 mg   divalproex (DEPAKOTE) DR tablet 250 mg   potassium chloride SA (KLOR-CON M) CR tablet 40 mEq     Harlowe Dowler, NP 08/31/2021 10:53 AM

## 2021-08-31 NOTE — Progress Notes (Signed)
IVC paperwork faxed to [facility].
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
## 2021-08-31 NOTE — Progress Notes (Signed)
Patient up to bathroom.  When returned discussed potassium level and AM medications.  Patient stated that she didn't need any potassium and doesn't take any medications.  Patient turned away from nurse and laid down.

## 2021-08-31 NOTE — Progress Notes (Signed)
Mother calling requesting update.  Patient declined to give staff permission to talk with mother.  Patient stated that she would call her.  Patient currently on phone with mother.

## 2021-08-31 NOTE — Progress Notes (Signed)
Patient has been denied by [facility] due to no appropriate beds being available. Patient meets [facility] inpatient criteria per Assunta Found, NP. Patient has been faxed out to the following facilities:    [facility]  [address], [place] [address] [phone]  [facility]  [address], [place] [address] [phone]  [facility]  [address], [place] [address] [phone]  [facility]  [address] [place] [address] [phone]  [facility]  [address], [place] [address] [phone]  [facility]  [address] [place], [place] [address] [phone]  [facility]  [address] [place], [place] [address] [phone]  [facility]  [phone] [place] [address] [phone]  [facility]  [address] [place] [address] [phone]  [facility]  [address], [place] [address] [phone]  [facility]  [address] [place] [address] [phone]  [facility]  [address] [place] [address] [phone]    Damita Dunnings, MSW, LCSW-A  12:20 PM 08/31/2021

## 2021-08-31 NOTE — ED Notes (Signed)
Pt refused EKG.

## 2021-08-31 NOTE — Progress Notes (Signed)
Patient resting on bed.  Alert, calm.  Denied SI, HI, AVH. Appropriate eye contact. Stated that she thinks she'll be going home today. Denied any needs at this time.  Continue to monitor for safety.
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
## 2021-08-31 NOTE — Progress Notes (Signed)
Patient resting in bed.  Pleasant, but not conversational.  Continues to deny need for medications.  Stated that the doctor told her she doesn't need to take them as she is going home today.  Continue to monitor for safety.

## 2021-08-31 NOTE — Progress Notes (Addendum)
[employer] here to transport patient to [facility].  Patient ambulated independently to sally port.  Given Jacket and shoes after clearance given by officer.  [employer] officer given remaining possessions.  Patient cooperative and calm.  Placed in [employer] per officer.  Paperwork, including IVC X3, given to [facility].  Patient discharged in stable condition.

## 2021-08-31 NOTE — Progress Notes (Signed)
Report called to [facility].  Sheriff office's notified of request to transfer.  Per [place], transport will be after 3 PM.

## 2021-08-31 NOTE — Progress Notes (Signed)
Pt accepted to [facility]    Patient meets inpatient criteria per Assunta Found, NP  The attending provider will be Estill Cotta, MD  Call report to [phone]  Robie Ridge, RN @ [facility] notified.     Pt scheduled  to arrive at [facility].    Damita Dunnings, MSW, LCSW-A  2:07 PM 08/31/2021
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
## 2021-08-31 NOTE — Progress Notes (Signed)
Patient sleeping.  Respirations even and unlabored.  Continue to monitor for assessment.

## 2021-09-12 ENCOUNTER — Telehealth (HOSPITAL_COMMUNITY): Payer: Self-pay | Admitting: Emergency Medicine

## 2021-09-12 NOTE — BH Assessment (Signed)
Care Management - Follow Up [facility] Discharges   Patient has been placed in an inpatient psychiatric hospital [facility]) on 08-31-21.

## 2021-10-11 ENCOUNTER — Ambulatory Visit (HOSPITAL_COMMUNITY)
Admission: EM | Admit: 2021-10-11 | Discharge: 2021-10-12 | Disposition: A | Discharge status: Home / Self Care | Payer: 59 | Attending: Student | Admitting: Student

## 2021-10-11 ENCOUNTER — Encounter (HOSPITAL_COMMUNITY): Payer: Self-pay | Admitting: Student

## 2021-10-11 DIAGNOSIS — F1210 Cannabis abuse, uncomplicated: Secondary | ICD-10-CM | POA: Diagnosis not present

## 2021-10-11 DIAGNOSIS — F32A Depression, unspecified: Secondary | ICD-10-CM | POA: Insufficient documentation

## 2021-10-11 DIAGNOSIS — D649 Anemia, unspecified: Secondary | ICD-10-CM | POA: Diagnosis not present

## 2021-10-11 DIAGNOSIS — Z20822 Contact with and (suspected) exposure to covid-19: Secondary | ICD-10-CM | POA: Insufficient documentation

## 2021-10-11 DIAGNOSIS — F29 Unspecified psychosis not due to a substance or known physiological condition: Secondary | ICD-10-CM | POA: Insufficient documentation

## 2021-10-11 DIAGNOSIS — Z046 Encounter for general psychiatric examination, requested by authority: Secondary | ICD-10-CM

## 2021-10-11 LAB — POCT PREGNANCY, URINE: Preg Test, Ur: NEGATIVE

## 2021-10-11 LAB — POCT URINE DRUG SCREEN - MANUAL ENTRY (I-SCREEN)
POC Amphetamine UR: NOT DETECTED
POC Buprenorphine (BUP): NOT DETECTED
POC Cocaine UR: NOT DETECTED
POC Marijuana UR: POSITIVE — AB
POC Methadone UR: NOT DETECTED
POC Methamphetamine UR: NOT DETECTED
POC Morphine: NOT DETECTED
POC Oxazepam (BZO): NOT DETECTED
POC Oxycodone UR: NOT DETECTED
POC Secobarbital (BAR): NOT DETECTED

## 2021-10-11 LAB — POC SARS CORONAVIRUS 2 AG -  ED: SARS Coronavirus 2 Ag: NEGATIVE

## 2021-10-11 LAB — CBC WITH DIFFERENTIAL/PLATELET

## 2021-10-11 LAB — POC SARS CORONAVIRUS 2 AG: SARSCOV2ONAVIRUS 2 AG: NEGATIVE

## 2021-10-11 MED ORDER — MAGNESIUM HYDROXIDE 400 MG/5ML PO SUSP: 30.0000 mL | Freq: Every day | ORAL | Status: DC | PRN

## 2021-10-11 MED ORDER — HYDROXYZINE HCL 25 MG PO TABS: 25.0000 mg | ORAL_TABLET | Freq: Three times a day (TID) | ORAL | Status: DC | PRN

## 2021-10-11 MED ORDER — ACETAMINOPHEN 325 MG PO TABS: 650.0000 mg | ORAL_TABLET | Freq: Four times a day (QID) | ORAL | Status: DC | PRN

## 2021-10-11 MED ORDER — TRAZODONE HCL 50 MG PO TABS: 50.0000 mg | ORAL_TABLET | Freq: Every evening | ORAL | Status: DC | PRN

## 2021-10-11 MED ORDER — ALUM & MAG HYDROXIDE-SIMETH 200-200-20 MG/5ML PO SUSP: 30.0000 mL | ORAL | Status: DC | PRN
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
## 2021-10-11 NOTE — ED Provider Notes (Signed)
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 and epidemiological information. The expected result is Negative.  Fact Sheet for Patients:  EntrepreneurPulse.com.au  Fact Sheet for Healthcare Providers:  IncredibleEmployment.be  This test is no                          t yet approved or cleared by the [place] FDA and  has been authorized for detection and/or diagnosis of SARS-CoV-2 by FDA under an Emergency Use Authorization (EUA). This EUA will remain  in effect (meaning  this test can be used) for the duration of the COVID-19 declaration under Section 564(b)(1) of the Act, 21 U.S.C.section 360bbb-3(b)(1), unless the authorization is terminated  or revoked sooner.       Influenza A by PCR 08/02/2021 NEGATIVE  NEGATIVE Final   Influenza B by PCR 08/02/2021 NEGATIVE  NEGATIVE Final   Comment: (NOTE) The Xpert Xpress SARS-CoV-2/FLU/RSV plus assay is intended as an aid in the diagnosis of influenza from Nasopharyngeal swab specimens and should not be used as a sole basis for treatment. Nasal washings and aspirates are unacceptable for Xpert Xpress SARS-CoV-2/FLU/RSV testing.  Fact Sheet for Patients: EntrepreneurPulse.com.au  Fact Sheet for Healthcare Providers: IncredibleEmployment.be  This test is not yet approved or cleared by the [place] FDA and has been authorized for detection and/or diagnosis of SARS-CoV-2 by FDA under an Emergency Use Authorization (EUA). This EUA will remain in effect (meaning this test can be used) for the duration of the COVID-19 declaration under Section 564(b)(1) of the Act, 21 U.S.C. section 360bbb-3(b)(1), unless the authorization is terminated or revoked.  Performed at [facility], [place] [address] [place], [place] [address]    SARS Coronavirus 2 Ag 08/02/2021 Negative  Negative Preliminary   WBC 08/02/2021 7.4  4.0 - 10.5 K/uL Final   RBC 08/02/2021 4.04  3.87 - 5.11 MIL/uL Final   Hemoglobin 08/02/2021 10.8 (L)  12.0 - 15.0 g/dL Final   HCT 16/10/960411/22/2022 34.8 (L)  36.0 - 46.0 % Final   MCV 08/02/2021 86.1  80.0 - 100.0 fL Final   MCH 08/02/2021 26.7  26.0 - 34.0 pg Final   MCHC 08/02/2021 31.0  30.0 - 36.0 g/dL Final   RDW 54/09/811911/22/2022 15.3  11.5 - 15.5 % Final   Platelets 08/02/2021 239  150 - 400 K/uL Final   nRBC 08/02/2021 0.0  0.0 - 0.2 % Final   Neutrophils Relative % 08/02/2021 51  % Final   Neutro Abs 08/02/2021 3.8  1.7 - 7.7 K/uL Final   Lymphocytes  Relative 08/02/2021 37  % Final   Lymphs Abs 08/02/2021 2.7  0.7 - 4.0 K/uL Final   Monocytes Relative 08/02/2021 10  % Final   Monocytes Absolute 08/02/2021 0.7  0.1 - 1.0 K/uL Final   Eosinophils Relative 08/02/2021 1  % Final   Eosinophils Absolute 08/02/2021 0.1  0.0 - 0.5 K/uL Final   Basophils Relative 08/02/2021 1  % Final   Basophils Absolute 08/02/2021 0.0  0.0 - 0.1 K/uL Final   Immature Granulocytes 08/02/2021 0  % Final   Abs Immature Granulocytes 08/02/2021 0.03  0.00 - 0.07 K/uL Final   Performed at [facility], [address] [place], [place] [address]   Sodium 08/02/2021 138  135 - 145 mmol/L Final   Potassium 08/02/2021 3.5  3.5 - 5.1 mmol/L Final   Chloride 08/02/2021 105  98 - 111 mmol/L Final   CO2 08/02/2021 24  22 - 32 mmol/L Final   Glucose, Bld 08/02/2021 103 (H)  70 - 99 mg/dL Final   Glucose reference range applies only to samples taken after fasting for at least 8 hours.   BUN 08/02/2021 19  6 - 20 mg/dL Final   Creatinine, Ser 08/02/2021 0.79  0.44 - 1.00 mg/dL Final   Calcium 95/62/130811/22/2022 9.3  8.9 - 10.3 mg/dL Final   Total Protein 65/78/469611/22/2022 7.3  6.5 - 8.1 g/dL Final   Albumin 29/52/841311/22/2022 4.1  3.5 - 5.0 g/dL Final   AST 24/40/102711/22/2022 23  15 - 41 U/L Final   ALT 08/02/2021 14  0 - 44 U/L Final   Alkaline Phosphatase 08/02/2021 45  38 - 126 U/L Final   Total Bilirubin 08/02/2021 0.7  0.3 - 1.2 mg/dL Final   GFR, Estimated 08/02/2021 >60  >60 mL/min Final   Comment: (NOTE) Calculated using the CKD-EPI Creatinine Equation (2021)    Anion gap 08/02/2021 9  5 - 15 Final   Performed at [facility], [address] [place], [place] [address]   Alcohol, Ethyl (B) 08/02/2021 <10  <10 mg/dL Final   Comment: (NOTE) Lowest detectable limit for serum alcohol is 10 mg/dL.  For medical purposes only. Performed at [facility], [address] [place], [place] [address]    POC Amphetamine UR 08/02/2021 None Detected  NONE DETECTED (Cut Off Level  1000 ng/mL) Final   POC Secobarbital (BAR) 08/02/2021 None Detected  NONE DETECTED (Cut Off Level 300 ng/mL) Final   POC Buprenorphine (BUP) 08/02/2021 None Detected  NONE DETECTED (Cut Off Level 10 ng/mL) Final   POC Oxazepam (BZO) 08/02/2021 None Detected  NONE DETECTED (Cut Off Level 300 ng/mL) Final   POC Cocaine UR 08/02/2021 None Detected  NONE DETECTED (Cut Off Level 300 ng/mL) Final   POC Methamphetamine UR 08/02/2021 None Detected  NONE DETECTED (Cut Off Level 1000 ng/mL) Final  POC Morphine 08/02/2021 None Detected  NONE DETECTED (Cut Off Level 300 ng/mL) Final   POC Oxycodone UR 08/02/2021 None Detected  NONE DETECTED (Cut Off Level 100 ng/mL) Final   POC Methadone UR 08/02/2021 None Detected  NONE DETECTED (Cut Off Level 300 ng/mL) Final   POC Marijuana UR 08/02/2021 Positive (A)  NONE DETECTED (Cut Off Level 50 ng/mL) Final   Valproic Acid Lvl 08/02/2021 <10 (L)  50.0 - 100.0 ug/mL Final   Comment: RESULTS CONFIRMED BY MANUAL DILUTION Performed at [facility], [place] [address] [place], [place] [address]    SARSCOV2ONAVIRUS 2 AG 08/02/2021 NEGATIVE  NEGATIVE Final   Comment: (NOTE) SARS-CoV-2 antigen NOT DETECTED.   Negative results are presumptive.  Negative results do not preclude SARS-CoV-2 infection and should not be used as the sole basis for treatment or other patient management decisions, including infection  control decisions, particularly in the presence of clinical signs and  symptoms consistent with COVID-19, or in those who have been in contact with the virus.  Negative results must be combined with clinical observations, patient history, and epidemiological information. The expected result is Negative.  Fact Sheet for Patients: HandmadeRecipes.com.cy  Fact Sheet for Healthcare Providers: FuneralLife.at  This test is not yet approved or cleared by the [place] FDA and  has been authorized for detection  and/or diagnosis of SARS-CoV-2 by FDA under an Emergency Use Authorization (EUA).  This EUA will remain in effect (meaning this test can be used) for the duration of  the COV                          ID-19 declaration under Section 564(b)(1) of the Act, 21 U.S.C. section 360bbb-3(b)(1), unless the authorization is terminated or revoked sooner.     Preg Test, Ur 08/02/2021 NEGATIVE  NEGATIVE Final   Comment:        THE SENSITIVITY OF THIS METHODOLOGY IS >24 mIU/mL   Admission on 04/12/2021, Discharged on 04/24/2021  Component Date Value Ref Range Status   Hgb A1c MFr Bld 04/12/2021 5.4  4.8 - 5.6 % Final   Comment: (NOTE) Pre diabetes:          5.7%-6.4%  Diabetes:              >6.4%  Glycemic control for   <7.0% adults with diabetes    Mean Plasma Glucose 04/12/2021 108.28  mg/dL Final   Performed at [facility], [place] [address], [place], [place] [address]   Cholesterol 04/12/2021 201 (H)  0 - 200 mg/dL Final   Triglycerides 04/12/2021 58  <150 mg/dL Final   HDL 04/12/2021 51  >40 mg/dL Final   Total CHOL/HDL Ratio 04/12/2021 3.9  RATIO Final   VLDL 04/12/2021 12  0 - 40 mg/dL Final   LDL Cholesterol 04/12/2021 138 (H)  0 - 99 mg/dL Final   Comment:        Total Cholesterol/HDL:CHD Risk Coronary Heart Disease Risk Table                     Men   Women  1/2 Average Risk   3.4   3.3  Average Risk       5.0   4.4  2 X Average Risk   9.6   7.1  3 X Average Risk  23.4   11.0        Use the calculated Patient Ratio above and  the CHD Risk Table to determine the patient's CHD Risk.        ATP III CLASSIFICATION (LDL):  <100     mg/dL   Optimal  100-129  mg/dL   Near or Above                    Optimal  130-159  mg/dL   Borderline  160-189  mg/dL   High  >190     mg/dL   Very High Performed at [place] [address], [place], [place] [address]    TSH 04/12/2021 1.988  0.350 - 4.500 uIU/mL Final   Comment: Performed by a 3rd Generation  assay with a functional sensitivity of <=0.01 uIU/mL. Performed at [facility], [place] [address], [place], [place] [address]    Anti Nuclear Antibody (ANA) 04/13/2021 Negative  Negative Final   Comment: (NOTE) Performed At: [facility] [place], [place] JY:[id number] Rush Farmer MD [id number]    Ceruloplasmin 04/13/2021 19.9  19.0 - 39.0 mg/dL Final   Comment: (NOTE) Performed At: [facility] [place], [place] JY:[id number] Rush Farmer MD [id number]    RPR Ser Ql 04/13/2021 NON REACTIVE  NON REACTIVE Final   Performed at [facility], [place] [address], [place], [place] [address]   Sed Rate 04/13/2021 6  0 - 22 mm/hr Final   Performed at [facility], [place] [address], [place], [place] [address]   Vitamin B-12 04/13/2021 329  180 - 914 pg/mL Final   Comment: (NOTE) This assay is not validated for testing neonatal or myeloproliferative syndrome specimens for Vitamin B12 levels. Performed at [facility], [place] [address], [place], [place] [address]    Sodium 04/13/2021 139  135 - 145 mmol/L Final   Potassium 04/13/2021 4.6  3.5 - 5.1 mmol/L Final   Chloride 04/13/2021 105  98 - 111 mmol/L Final   CO2 04/13/2021 24  22 - 32 mmol/L Final   Glucose, Bld 04/13/2021 130 (H)  70 - 99 mg/dL Final   Glucose reference range applies only to samples taken after fasting for at least 8 hours.   BUN 04/13/2021 16  6 - 20 mg/dL Final   Creatinine, Ser 04/13/2021 0.87  0.44 - 1.00 mg/dL Final   Calcium 04/13/2021 10.2  8.9 - 10.3 mg/dL Final   Total Protein 04/13/2021 8.1  6.5 - 8.1 g/dL Final   Albumin 04/13/2021 5.0  3.5 - 5.0 g/dL Final   AST 04/13/2021 23  15 - 41 U/L Final   ALT 04/13/2021 13  0 - 44 U/L Final   Alkaline Phosphatase 04/13/2021 55  38 - 126 U/L Final   Total Bilirubin 04/13/2021 0.6  0.3 - 1.2 mg/dL Final   GFR, Estimated 04/13/2021 >60  >60 mL/min Final   Comment:  (NOTE) Calculated using the CKD-EPI Creatinine Equation (2021)    Anion gap 04/13/2021 10  5 - 15 Final   Performed at [facility], [place] [address] [place], [place] [address]   SARS Coronavirus 2 by RT PCR 04/19/2021 NEGATIVE  NEGATIVE Final   Comment: (NOTE) SARS-CoV-2 target nucleic acids are NOT DETECTED.  The SARS-CoV-2 RNA is generally detectable in upper respiratory specimens during the acute phase of infection. The lowest concentration of SARS-CoV-2 viral copies this assay can detect is 138 copies/mL. A negative result does not preclude SARS-Cov-2 infection and should not be used as the sole  basis for treatment or other patient management decisions. A negative result may occur with  improper specimen collection/handling, submission of specimen other than nasopharyngeal swab, presence of viral mutation(s) within the areas targeted by this assay, and inadequate number of viral copies(<138 copies/mL). A negative result must be combined with clinical observations, patient history, and epidemiological information. The expected result is Negative.  Fact Sheet for Patients:  EntrepreneurPulse.com.au  Fact Sheet for Healthcare Providers:  IncredibleEmployment.be  This test is no                          t yet approved or cleared by the [place] FDA and  has been authorized for detection and/or diagnosis of SARS-CoV-2 by FDA under an Emergency Use Authorization (EUA). This EUA will remain  in effect (meaning this test can be used) for the duration of the COVID-19 declaration under Section 564(b)(1) of the Act, 21 U.S.C.section 360bbb-3(b)(1), unless the authorization is terminated  or revoked sooner.       Influenza A by PCR 04/19/2021 NEGATIVE  NEGATIVE Final   Influenza B by PCR 04/19/2021 NEGATIVE  NEGATIVE Final   Comment: (NOTE) The Xpert Xpress SARS-CoV-2/FLU/RSV plus assay is intended as an aid in the  diagnosis of influenza from Nasopharyngeal swab specimens and should not be used as a sole basis for treatment. Nasal washings and aspirates are unacceptable for Xpert Xpress SARS-CoV-2/FLU/RSV testing.  Fact Sheet for Patients: EntrepreneurPulse.com.au  Fact Sheet for Healthcare Providers: IncredibleEmployment.be  This test is not yet approved or cleared by the [place] FDA and has been authorized for detection and/or diagnosis of SARS-CoV-2 by FDA under an Emergency Use Authorization (EUA). This EUA will remain in effect (meaning this test can be used) for the duration of the COVID-19 declaration under Section 564(b)(1) of the Act, 21 U.S.C. section 360bbb-3(b)(1), unless the authorization is terminated or revoked.  Performed at [facility], [place] [address], [place], [place] [address]    Color, Urine 04/22/2021 STRAW (A)  YELLOW Final   APPearance 04/22/2021 CLEAR  CLEAR Final   Specific Gravity, Urine 04/22/2021 1.012  1.005 - 1.030 Final   pH 04/22/2021 7.0  5.0 - 8.0 Final   Glucose, UA 04/22/2021 NEGATIVE  NEGATIVE mg/dL Final   Hgb urine dipstick 04/22/2021 NEGATIVE  NEGATIVE Final   Bilirubin Urine 04/22/2021 NEGATIVE  NEGATIVE Final   Ketones, ur 04/22/2021 5 (A)  NEGATIVE mg/dL Final   Protein, ur 04/22/2021 NEGATIVE  NEGATIVE mg/dL Final   Nitrite 04/22/2021 NEGATIVE  NEGATIVE Final   Leukocytes,Ua 04/22/2021 TRACE (A)  NEGATIVE Final   RBC / HPF 04/22/2021 0-5  0 - 5 RBC/hpf Final   WBC, UA 04/22/2021 0-5  0 - 5 WBC/hpf Final   Bacteria, UA 04/22/2021 NONE SEEN  NONE SEEN Final   Squamous Epithelial / LPF 04/22/2021 0-5  0 - 5 Final   Mucus 04/22/2021 PRESENT   Final   Performed at [facility], [place] [address], [place], [place] [address]   Chlamydia 04/21/2021 Positive (A)   Final   Neisseria Gonorrhea 04/21/2021 Negative   Final   Comment 04/21/2021 Normal Reference Ranger Chlamydia -  Negative   Final   Comment 04/21/2021 Normal Reference Range Neisseria Gonorrhea - Negative   Final   Valproic Acid Lvl 04/22/2021 57  50.0 - 100.0 ug/mL Final   Performed at [facility], [place] [address], [place], [place] [address]   WBC 04/24/2021  4.2  4.0 - 10.5 K/uL Final   RBC 04/24/2021 4.31  3.87 - 5.11 MIL/uL Final   Hemoglobin 04/24/2021 11.9 (L)  12.0 - 15.0 g/dL Final   HCT 04/24/2021 37.1  36.0 - 46.0 % Final   MCV 04/24/2021 86.1  80.0 - 100.0 fL Final   MCH 04/24/2021 27.6  26.0 - 34.0 pg Final   MCHC 04/24/2021 32.1  30.0 - 36.0 g/dL Final   RDW 04/24/2021 15.9 (H)  11.5 - 15.5 % Final   Platelets 04/24/2021 183  150 - 400 K/uL Final   nRBC 04/24/2021 0.0  0.0 - 0.2 % Final   Neutrophils Relative % 04/24/2021 45  % Final   Neutro Abs 04/24/2021 2.0  1.7 - 7.7 K/uL Final   Lymphocytes Relative 04/24/2021 42  % Final   Lymphs Abs 04/24/2021 1.8  0.7 - 4.0 K/uL Final   Monocytes Relative 04/24/2021 10  % Final   Monocytes Absolute 04/24/2021 0.4  0.1 - 1.0 K/uL Final   Eosinophils Relative 04/24/2021 2  % Final   Eosinophils Absolute 04/24/2021 0.1  0.0 - 0.5 K/uL Final   Basophils Relative 04/24/2021 1  % Final   Basophils Absolute 04/24/2021 0.0  0.0 - 0.1 K/uL Final   Immature Granulocytes 04/24/2021 0  % Final   Abs Immature Granulocytes 04/24/2021 0.01  0.00 - 0.07 K/uL Final   Performed at [facility], [place] [address] [place], [place] [address]   Sodium 04/24/2021 141  135 - 145 mmol/L Final   Potassium 04/24/2021 4.0  3.5 - 5.1 mmol/L Final   Chloride 04/24/2021 106  98 - 111 mmol/L Final   CO2 04/24/2021 25  22 - 32 mmol/L Final   Glucose, Bld 04/24/2021 112 (H)  70 - 99 mg/dL Final   Glucose reference range applies only to samples taken after fasting for at least 8 hours.   BUN 04/24/2021 11  6 - 20 mg/dL Final   Creatinine, Ser 04/24/2021 0.87  0.44 - 1.00 mg/dL Final   Calcium 04/24/2021 9.5  8.9 - 10.3 mg/dL Final    Total Protein 04/24/2021 7.3  6.5 - 8.1 g/dL Final   Albumin 04/24/2021 4.3  3.5 - 5.0 g/dL Final   AST 04/24/2021 25  15 - 41 U/L Final   ALT 04/24/2021 14  0 - 44 U/L Final   Alkaline Phosphatase 04/24/2021 48  38 - 126 U/L Final   Total Bilirubin 04/24/2021 0.6  0.3 - 1.2 mg/dL Final   GFR, Estimated 04/24/2021 >60  >60 mL/min Final   Comment: (NOTE) Calculated using the CKD-EPI Creatinine Equation (2021)    Anion gap 04/24/2021 10  5 - 15 Final   Performed at [facility], [place] [address] [place], [place] [address]   Valproic Acid Lvl 04/24/2021 100  50.0 - 100.0 ug/mL Final   Performed at [facility], [place] [address], [place], [place] [address]  Admission on 04/12/2021, Discharged on 04/12/2021  Component Date Value Ref Range Status   Preg Test, Ur 04/12/2021 NEGATIVE  NEGATIVE Final   Comment:        THE SENSITIVITY OF THIS METHODOLOGY IS >20 mIU/mL. Performed at [facility], [place] [address] [place], [place] [address]    Opiates 04/12/2021 NONE DETECTED  NONE DETECTED Final   Cocaine 04/12/2021 NONE DETECTED  NONE DETECTED Final   Benzodiazepines 04/12/2021 NONE DETECTED  NONE DETECTED Final   Amphetamines 04/12/2021 NONE DETECTED  NONE DETECTED  Final   Tetrahydrocannabinol 04/12/2021 POSITIVE (A)  NONE DETECTED Final   Barbiturates 04/12/2021 NONE DETECTED  NONE DETECTED Final   Comment: (NOTE) DRUG SCREEN FOR MEDICAL PURPOSES ONLY.  IF CONFIRMATION IS NEEDED FOR ANY PURPOSE, NOTIFY LAB WITHIN 5 DAYS.  LOWEST DETECTABLE LIMITS FOR URINE DRUG SCREEN Drug Class                     Cutoff (ng/mL) Amphetamine and metabolites    1000 Barbiturate and metabolites    200 Benzodiazepine                 [id number] Tricyclics and metabolites     300 Opiates and metabolites        300 Cocaine and metabolites        300 THC                            50 Performed at [facility], [place] [address], [place], [place] [address]   Admission on 04/11/2021, Discharged on 04/12/2021  Component Date Value Ref Range Status   SARS Coronavirus 2 Ag 04/12/2021 Negative  Negative Preliminary   WBC 04/12/2021 6.2  4.0 - 10.5 K/uL Final   RBC 04/12/2021 4.24  3.87 - 5.11 MIL/uL Final   Hemoglobin 04/12/2021 11.6 (L)  12.0 - 15.0 g/dL Final   HCT 04/12/2021 36.4  36.0 - 46.0 % Final   MCV 04/12/2021 85.8  80.0 - 100.0 fL Final   MCH 04/12/2021 27.4  26.0 - 34.0 pg Final   MCHC 04/12/2021 31.9  30.0 - 36.0 g/dL Final   RDW 04/12/2021 16.3 (H)  11.5 - 15.5 % Final   Platelets 04/12/2021 193  150 - 400 K/uL Final   nRBC 04/12/2021 0.0  0.0 - 0.2 % Final   Performed at [place] [address], [place], [place] [address]   Sodium 04/12/2021 136  135 - 145 mmol/L Final   Potassium 04/12/2021 3.0 (L)  3.5 - 5.1 mmol/L Final   Chloride 04/12/2021 105  98 - 111 mmol/L Final   CO2 04/12/2021 22  22 - 32 mmol/L Final   Glucose, Bld 04/12/2021 83  70 - 99 mg/dL Final   Glucose reference range applies only to samples taken after fasting for at least 8 hours.   BUN 04/12/2021 10  6 - 20 mg/dL Final   Creatinine, Ser 04/12/2021 0.87  0.44 - 1.00 mg/dL Final   Calcium 04/12/2021 9.6  8.9 - 10.3 mg/dL Final   Total Protein 04/12/2021 7.7  6.5 - 8.1 g/dL Final   Albumin 04/12/2021 4.6  3.5 - 5.0 g/dL Final   AST 04/12/2021 26  15 - 41 U/L Final   ALT 04/12/2021 15  0 - 44 U/L Final   Alkaline Phosphatase 04/12/2021 48  38 - 126 U/L Final   Total Bilirubin 04/12/2021 0.8  0.3 - 1.2 mg/dL Final   GFR, Estimated 04/12/2021 >60  >60 mL/min Final   Comment: (NOTE) Calculated using the CKD-EPI Creatinine Equation (2021)    Anion gap 04/12/2021 9  5 - 15 Final   Performed at [place] [address], [place], [place] [address]   SARS Coronavirus 2 by RT PCR 04/12/2021 NEGATIVE  NEGATIVE Final   Comment: (NOTE) SARS-CoV-2 target nucleic acids are NOT DETECTED.  The SARS-CoV-2 RNA is  generally detectable in upper respiratory specimens during the acute phase of  infection. The lowest concentration of SARS-CoV-2 viral copies this assay can detect is 138 copies/mL. A negative result does not preclude SARS-Cov-2 infection and should not be used as the sole basis for treatment or other patient management decisions. A negative result may occur with  improper specimen collection/handling, submission of specimen other than nasopharyngeal swab, presence of viral mutation(s) within the areas targeted by this assay, and inadequate number of viral copies(<138 copies/mL). A negative result must be combined with clinical observations, patient history, and epidemiological information. The expected result is Negative.  Fact Sheet for Patients:  EntrepreneurPulse.com.au  Fact Sheet for Healthcare Providers:  IncredibleEmployment.be  This test is no                          t yet approved or cleared by the [place] FDA and  has been authorized for detection and/or diagnosis of SARS-CoV-2 by FDA under an Emergency Use Authorization (EUA). This EUA will remain  in effect (meaning this test can be used) for the duration of the COVID-19 declaration under Section 564(b)(1) of the Act, 21 U.S.C.section 360bbb-3(b)(1), unless the authorization is terminated  or revoked sooner.       Influenza A by PCR 04/12/2021 NEGATIVE  NEGATIVE Final   Influenza B by PCR 04/12/2021 NEGATIVE  NEGATIVE Final   Comment: (NOTE) The Xpert Xpress SARS-CoV-2/FLU/RSV plus assay is intended as an aid in the diagnosis of influenza from Nasopharyngeal swab specimens and should not be used as a sole basis for treatment. Nasal washings and aspirates are unacceptable for Xpert Xpress SARS-CoV-2/FLU/RSV testing.  Fact Sheet for Patients: EntrepreneurPulse.com.au  Fact Sheet for Healthcare Providers: IncredibleEmployment.be  This  test is not yet approved or cleared by the [place] FDA and has been authorized for detection and/or diagnosis of SARS-CoV-2 by FDA under an Emergency Use Authorization (EUA). This EUA will remain in effect (meaning this test can be used) for the duration of the COVID-19 declaration under Section 564(b)(1) of the Act, 21 U.S.C. section 360bbb-3(b)(1), unless the authorization is terminated or revoked.  Performed at [facility], [place] [address], [place], [place] [address]     Allergies: Patient has no known allergies.  PTA Medications: (Not in a hospital admission)   Medical Decision Making  Patient is a 21 year old female with past psychiatric and medical history as stated above who presents to the [facility] via law enforcement under IVC (see HPI and/or IVC paperwork for further details).  Based on information in IVC paperwork and collateral information obtained from patient's mother by TTS, the patient appears to be a potential danger to herself and others at this time and thus, recommend continuous assessment for the patient at this time.    Recommendations  Based on my evaluation the patient does not appear to have an emergency medical condition.  Patient will be admitted to  [facility] continuous assessment for further crisis stabilization and treatment.  Patient will be reevaluated by the treatment team on 10/12/2021 and disposition to be determined at that time.  First exam for patient's IVC to be completed at time of 10/12/2021 reevaluation as well.  Labs ordered and reviewed:  -PCR Flu A&B, COVID: Negative  -Urine pregnancy: Negative  -UDS: Positive for marijuana  -CBC with differential: Normocytic anemia noted with hemoglobin slightly reduced at 10.6 g/dL, hematocrit slightly reduced at 34.7%, RDW slightly increased at 15.9%.  Hemoglobin and hematocrit values appear to be relatively similar to previous values from 1  and 2 months ago.  Based on patient's current presentation and  lack of physical symptoms, do not suspect that these lab values are indicative of an emergent medical condition at this time.  CBC otherwise unremarkable.  -CMP: Within normal limits  -Ethanol: Within normal limits/less than 10 mg/dL  -Valproic acid level ordered due to patient's history of taking Depakote: Subtherapeutic at less than 10 ug/mL  Previous labs reviewed:  -08/31/2021 hemoglobin A1c: Within normal limits at 5.2%  -08/31/2021 lipid panel: LDL cholesterol elevated at 132 mg/dL.  Lipid panel otherwise unremarkable.  -08/31/2021 TSH: Within normal limits at 2.410 uIU/mL  -08/31/2021 EKG: EKG tracing not viewable in the chart at this time.  However, Per chart review of 08/31/2021 documentation, 08/31/2021 EKG showed no acute or concerning findings with QT/QTC of 366/432 ms.   Similar to previous [facility] admissions, patient states that she is not going to take any medication at this time.   Orders placed for the following as needed medications:  -Tylenol 650 mg p.o. every 6 hours as needed for mild pain  -Maalox/Mylanta 30 mL p.o. every 4 hours as needed for indigestion  -Hydroxyzine 25 mg p.o. 3 times daily as needed for anxiety  -Milk of Magnesia 30 mL p.o. daily as needed for mild constipation  -Trazodone 50 mg p.o. at bedtime as needed for sleep  Prescilla Sours, PA-C 10/11/21  9:45 PM

## 2021-10-12 ENCOUNTER — Other Ambulatory Visit: Payer: Self-pay

## 2021-10-12 DIAGNOSIS — Z046 Encounter for general psychiatric examination, requested by authority: Secondary | ICD-10-CM

## 2021-10-12 DIAGNOSIS — F29 Unspecified psychosis not due to a substance or known physiological condition: Secondary | ICD-10-CM

## 2021-10-12 LAB — CBC WITH DIFFERENTIAL/PLATELET
Abs Immature Granulocytes: 0 10*3/uL (ref 0.00–0.07)
Basophils Absolute: 0.1 10*3/uL (ref 0.0–0.1)
Basophils Relative: 1 %
Eosinophils Absolute: 0.4 10*3/uL (ref 0.0–0.5)
Eosinophils Relative: 4 %
HCT: 34.7 % — ABNORMAL LOW (ref 36.0–46.0)
Hemoglobin: 10.6 g/dL — ABNORMAL LOW (ref 12.0–15.0)
Lymphocytes Relative: 19 %
Lymphs Abs: 1.8 10*3/uL (ref 0.7–4.0)
MCH: 27.1 pg (ref 26.0–34.0)
MCHC: 30.5 g/dL (ref 30.0–36.0)
MCV: 88.7 fL (ref 80.0–100.0)
Monocytes Absolute: 0.6 10*3/uL (ref 0.1–1.0)
Monocytes Relative: 6 %
Neutro Abs: 6.6 10*3/uL (ref 1.7–7.7)
Neutrophils Relative %: 70 %
Platelets: 264 10*3/uL (ref 150–400)
RBC: 3.91 MIL/uL (ref 3.87–5.11)
RDW: 15.9 % — ABNORMAL HIGH (ref 11.5–15.5)
WBC: 9.4 10*3/uL (ref 4.0–10.5)
nRBC: 0 % (ref 0.0–0.2)

## 2021-10-12 LAB — RESP PANEL BY RT-PCR (FLU A&B, COVID) ARPGX2
Influenza A by PCR: NEGATIVE
Influenza B by PCR: NEGATIVE
SARS Coronavirus 2 by RT PCR: NEGATIVE

## 2021-10-12 LAB — ETHANOL: Alcohol, Ethyl (B): <10 mg/dL (ref <10)

## 2021-10-12 LAB — COMPREHENSIVE METABOLIC PANEL
ALT: 19 U/L (ref 0–44)
AST: 18 U/L (ref 15–41)
Albumin: 3.6 g/dL (ref 3.5–5.0)
Alkaline Phosphatase: 67 U/L (ref 38–126)
Anion gap: 6 (ref 5–15)
BUN: 13 mg/dL (ref 6–20)
CO2: 28 mmol/L (ref 22–32)
Calcium: 9.1 mg/dL (ref 8.9–10.3)
Chloride: 110 mmol/L (ref 98–111)
Creatinine, Ser: 0.74 mg/dL (ref 0.44–1.00)
GFR, Estimated: >60 mL/min (ref >60)
Glucose, Bld: 88 mg/dL (ref 70–99)
Potassium: 3.6 mmol/L (ref 3.5–5.1)
Sodium: 144 mmol/L (ref 135–145)
Total Bilirubin: 0.3 mg/dL (ref 0.3–1.2)
Total Protein: 6.7 g/dL (ref 6.5–8.1)

## 2021-10-12 LAB — VALPROIC ACID LEVEL: Valproic Acid Lvl: <10 ug/mL — ABNORMAL LOW (ref 50.0–100.0)
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
## 2021-10-12 NOTE — ED Notes (Signed)
Pt sleeping at present, no distress noted.  Monitoring for safety. 

## 2021-10-12 NOTE — Discharge Instructions (Addendum)
Please contact one of the following facilities to start medication management and therapy services:  ° °[facility] °[address]  °[place], [place] [address] °[phone]  ° °[facility]  °[address] °[place], [place] [address] °[phone] ° °[facility]  °[address], [place], [place] [address] °[phone] ° °[facility]  °[address]  °[place], [place] [address] °[phone] ° °[facility]  °[address] °[place], [place] [address] °[phone] ° °[facility]  °[address],  °, [place] [address] °[phone] ° °Patient is instructed prior to discharge to: ° Take all medications as prescribed by his/her mental healthcare provider. °Report any adverse effects and or reactions from the medicines to his/her outpatient provider promptly. °Keep all scheduled appointments, to ensure that you are getting refills on time and to avoid any interruption in your medication.  If you are unable to keep an appointment call to reschedule.  Be sure to follow-up with resources and follow-up appointments provided.  °Patient has been instructed & cautioned: To not engage in alcohol and or illegal drug use while on prescription medicines. °In the event of worsening symptoms, patient is instructed to call the crisis hotline, 911 and or go to the nearest ED for appropriate evaluation and treatment of symptoms. °To follow-up with his/her primary care provider for your other medical issues, concerns and or health care needs. °  ° °

## 2021-10-12 NOTE — ED Notes (Signed)
Patient A&O x 4, ambulatory. Patient discharged in no acute distress. Patient denied SI/HI, A/VH upon discharge. Patient verbalized understanding of all discharge instructions explained by staff, to include follow up appointment and safety plan. Patient reported mood 10/10.  Pt belongings returned to patient from locker room intact. Patient escorted to [place] via staff for transport to pt's home address. Safety maintained.

## 2021-10-12 NOTE — ED Notes (Signed)
Pt sleeping in no acute distress. RR even and unlabored. Safety maintained. 

## 2021-10-12 NOTE — ED Notes (Signed)
Pt sleeping but easily aroused to name being called. Pleasant and cooperative with staff throughout assessment process. Denies SI/HI/AVH . Pt states, "I feel great today. I was suppose to be to work at 11 this morning. Is there anyway I could discharge to go to work?". NP was notified and a visit from provider was made. Plan was to discharge pt. Reviewed AVS with pt. Verbalized understanding of discharge information provided. Safe transport called. Informed pt to notify staff with any needs or concerns. Will continue to monitor for safety.
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
